# Patient Record
Sex: Male | Born: 1975
Health system: Southern US, Community
[De-identification: ages and names within clinical notes are randomized; demographics above are authoritative.]

## PROBLEM LIST (undated history)

## (undated) DIAGNOSIS — Z9889 Other specified postprocedural states: Secondary | ICD-10-CM

## (undated) DIAGNOSIS — R112 Nausea with vomiting, unspecified: Secondary | ICD-10-CM

## (undated) DIAGNOSIS — I10 Essential (primary) hypertension: Secondary | ICD-10-CM

## (undated) DIAGNOSIS — D509 Iron deficiency anemia, unspecified: Secondary | ICD-10-CM

## (undated) HISTORY — DX: Iron deficiency anemia, unspecified: D50.9

## (undated) HISTORY — PX: TONSILLECTOMY: SUR1361

## (undated) HISTORY — PX: KNEE ARTHROSCOPY: SHX127

## (undated) HISTORY — PX: FRACTURE SURGERY: SHX138

---

## 2000-08-13 ENCOUNTER — Emergency Department (HOSPITAL_COMMUNITY): Admission: EM | Admit: 2000-08-13 | Discharge: 2000-08-13 | Payer: Self-pay | Admitting: Emergency Medicine

## 2000-08-14 ENCOUNTER — Encounter: Payer: Self-pay | Admitting: Emergency Medicine

## 2001-12-19 ENCOUNTER — Ambulatory Visit (HOSPITAL_COMMUNITY): Admission: RE | Admit: 2001-12-19 | Discharge: 2001-12-19 | Payer: Self-pay | Admitting: Internal Medicine

## 2001-12-19 ENCOUNTER — Encounter: Payer: Self-pay | Admitting: Internal Medicine

## 2002-06-28 ENCOUNTER — Ambulatory Visit (HOSPITAL_COMMUNITY): Admission: RE | Admit: 2002-06-28 | Discharge: 2002-06-28 | Payer: Self-pay | Admitting: Family Medicine

## 2002-06-28 ENCOUNTER — Encounter: Payer: Self-pay | Admitting: Family Medicine

## 2003-11-12 ENCOUNTER — Emergency Department (HOSPITAL_COMMUNITY): Admission: EM | Admit: 2003-11-12 | Discharge: 2003-11-12 | Payer: Self-pay | Admitting: *Deleted

## 2003-11-14 ENCOUNTER — Ambulatory Visit (HOSPITAL_BASED_OUTPATIENT_CLINIC_OR_DEPARTMENT_OTHER): Admission: RE | Admit: 2003-11-14 | Discharge: 2003-11-14 | Payer: Self-pay | Admitting: Orthopedic Surgery

## 2003-11-14 ENCOUNTER — Ambulatory Visit (HOSPITAL_COMMUNITY): Admission: RE | Admit: 2003-11-14 | Discharge: 2003-11-14 | Payer: Self-pay | Admitting: Orthopedic Surgery

## 2010-09-16 ENCOUNTER — Ambulatory Visit (HOSPITAL_COMMUNITY)
Admission: RE | Admit: 2010-09-16 | Discharge: 2010-09-16 | Disposition: A | Discharge status: Home / Self Care | Payer: BC Managed Care – PPO | Source: Ambulatory Visit | Attending: Family Medicine | Admitting: Family Medicine

## 2010-09-16 ENCOUNTER — Other Ambulatory Visit (HOSPITAL_COMMUNITY): Payer: Self-pay | Admitting: Family Medicine

## 2010-09-16 DIAGNOSIS — M25569 Pain in unspecified knee: Secondary | ICD-10-CM

## 2010-09-16 DIAGNOSIS — M25469 Effusion, unspecified knee: Secondary | ICD-10-CM

## 2011-10-12 ENCOUNTER — Encounter: Payer: Self-pay | Admitting: Family Medicine

## 2011-10-12 ENCOUNTER — Ambulatory Visit (INDEPENDENT_AMBULATORY_CARE_PROVIDER_SITE_OTHER): Payer: 59 | Admitting: Family Medicine

## 2011-10-12 DIAGNOSIS — R030 Elevated blood-pressure reading, without diagnosis of hypertension: Secondary | ICD-10-CM

## 2011-10-12 DIAGNOSIS — L259 Unspecified contact dermatitis, unspecified cause: Secondary | ICD-10-CM

## 2011-10-12 DIAGNOSIS — IMO0001 Reserved for inherently not codable concepts without codable children: Secondary | ICD-10-CM | POA: Insufficient documentation

## 2011-10-12 MED ORDER — TRIAMCINOLONE ACETONIDE 0.1 % EX CREA: TOPICAL_CREAM | CUTANEOUS | Status: DC

## 2011-10-12 MED ORDER — PREDNISONE 10 MG PO TABS: 10.0000 mg | ORAL_TABLET | Freq: Every day | ORAL | Status: DC

## 2011-10-12 NOTE — Progress Notes (Signed)
  Subjective:    Patient ID: Brent West, male    DOB: June 06, 1975, 36 y.o.   MRN: [id number]  HPI  Rash or 3 days. 4 days ago was bitten by a tick. Removed the tick was not engorged. He has had no fevers. Noticed a small area of redness around the tick bite and started putting some antibiotic ointment on it. Also noticed a small red area in the mid sternal area and started putting antibiotic ointment on that as well. Last night and this morning the both areas are inflamed, rash apparent, itchy. No fevers, no myalgias.  Elevated blood pressure today. He reports it has been intermittently elevated on the few times that he seen a physician in the last few years. He does not have any symptoms of headache, chest pain, shortness of breath, edema. He has never been on blood pressure medicine.  Review of Systems See HPI    Objective:   Physical Exam  Vital signs reviewed. GENERAL: Well-developed, well-nourished, no acute distress. ABDOMEN: Soft positive bowel sounds. NT no rebound SKIN: two well circumscribed square shaped area 94x4) covered by gauze. Underneath there is paulr red area with some confluece. No satellte lesions. Square areas are in central chest over sternum and left mid quadrant and co-incide with areas under 4x4 quaze. Borders are very distinct.        Assessment & Plan:

## 2011-11-17 ENCOUNTER — Ambulatory Visit: Payer: 59 | Admitting: Family Medicine

## 2015-08-15 DIAGNOSIS — I10 Essential (primary) hypertension: Secondary | ICD-10-CM | POA: Diagnosis not present

## 2015-08-15 DIAGNOSIS — E782 Mixed hyperlipidemia: Secondary | ICD-10-CM | POA: Diagnosis not present

## 2015-08-15 DIAGNOSIS — E6609 Other obesity due to excess calories: Secondary | ICD-10-CM | POA: Diagnosis not present

## 2015-08-15 DIAGNOSIS — Z1389 Encounter for screening for other disorder: Secondary | ICD-10-CM | POA: Diagnosis not present

## 2015-08-15 DIAGNOSIS — Z6834 Body mass index (BMI) 34.0-34.9, adult: Secondary | ICD-10-CM | POA: Diagnosis not present

## 2015-08-15 MED FILL — LISINOPRIL 20 MG TABLET: 20 | 90 days supply | Qty: 180 | Fill #0

## 2015-11-17 DIAGNOSIS — Z681 Body mass index (BMI) 19 or less, adult: Secondary | ICD-10-CM | POA: Diagnosis not present

## 2015-11-17 DIAGNOSIS — Z1389 Encounter for screening for other disorder: Secondary | ICD-10-CM | POA: Diagnosis not present

## 2015-11-17 DIAGNOSIS — J069 Acute upper respiratory infection, unspecified: Secondary | ICD-10-CM | POA: Diagnosis not present

## 2015-11-17 DIAGNOSIS — H6122 Impacted cerumen, left ear: Secondary | ICD-10-CM | POA: Diagnosis not present

## 2015-12-15 MED FILL — LISINOPRIL 20 MG TABLET: 20 | 90 days supply | Qty: 180 | Fill #1

## 2016-03-25 MED FILL — LISINOPRIL 20 MG TABLET: 20 | 90 days supply | Qty: 180 | Fill #2

## 2016-04-14 DIAGNOSIS — K219 Gastro-esophageal reflux disease without esophagitis: Secondary | ICD-10-CM | POA: Diagnosis not present

## 2016-04-14 DIAGNOSIS — R070 Pain in throat: Secondary | ICD-10-CM | POA: Diagnosis not present

## 2016-04-14 DIAGNOSIS — Z1389 Encounter for screening for other disorder: Secondary | ICD-10-CM | POA: Diagnosis not present

## 2016-04-14 DIAGNOSIS — J343 Hypertrophy of nasal turbinates: Secondary | ICD-10-CM | POA: Diagnosis not present

## 2016-04-14 DIAGNOSIS — I10 Essential (primary) hypertension: Secondary | ICD-10-CM | POA: Diagnosis not present

## 2016-04-14 DIAGNOSIS — J069 Acute upper respiratory infection, unspecified: Secondary | ICD-10-CM | POA: Diagnosis not present

## 2016-04-14 DIAGNOSIS — Z6834 Body mass index (BMI) 34.0-34.9, adult: Secondary | ICD-10-CM | POA: Diagnosis not present

## 2016-04-14 DIAGNOSIS — J329 Chronic sinusitis, unspecified: Secondary | ICD-10-CM | POA: Diagnosis not present

## 2016-07-19 MED FILL — LISINOPRIL 20 MG TABLET: 20 | 90 days supply | Qty: 180 | Fill #3

## 2016-08-16 DIAGNOSIS — Z1389 Encounter for screening for other disorder: Secondary | ICD-10-CM | POA: Diagnosis not present

## 2016-08-16 DIAGNOSIS — M7060 Trochanteric bursitis, unspecified hip: Secondary | ICD-10-CM | POA: Diagnosis not present

## 2016-08-16 DIAGNOSIS — Z6833 Body mass index (BMI) 33.0-33.9, adult: Secondary | ICD-10-CM | POA: Diagnosis not present

## 2016-10-29 MED FILL — LISINOPRIL 20 MG TABLET: 20 | 90 days supply | Qty: 180 | Fill #0

## 2017-02-21 MED FILL — LISINOPRIL 20 MG TABS: 20 | 90 days supply | Qty: 180 | Fill #1

## 2017-05-01 ENCOUNTER — Encounter (HOSPITAL_COMMUNITY): Payer: Self-pay | Admitting: Emergency Medicine

## 2017-05-01 ENCOUNTER — Emergency Department (HOSPITAL_COMMUNITY): Payer: 59

## 2017-05-01 ENCOUNTER — Emergency Department (HOSPITAL_COMMUNITY)
Admission: EM | Admit: 2017-05-01 | Discharge: 2017-05-01 | Disposition: A | Discharge status: Home / Self Care | Payer: 59 | Attending: Emergency Medicine | Admitting: Emergency Medicine

## 2017-05-01 ENCOUNTER — Other Ambulatory Visit: Payer: Self-pay

## 2017-05-01 DIAGNOSIS — I10 Essential (primary) hypertension: Secondary | ICD-10-CM | POA: Diagnosis not present

## 2017-05-01 DIAGNOSIS — R0789 Other chest pain: Secondary | ICD-10-CM | POA: Diagnosis not present

## 2017-05-01 DIAGNOSIS — D649 Anemia, unspecified: Secondary | ICD-10-CM | POA: Insufficient documentation

## 2017-05-01 DIAGNOSIS — R079 Chest pain, unspecified: Secondary | ICD-10-CM | POA: Diagnosis not present

## 2017-05-01 DIAGNOSIS — R0602 Shortness of breath: Secondary | ICD-10-CM | POA: Diagnosis not present

## 2017-05-01 DIAGNOSIS — Z79899 Other long term (current) drug therapy: Secondary | ICD-10-CM | POA: Diagnosis not present

## 2017-05-01 HISTORY — DX: Essential (primary) hypertension: I10

## 2017-05-01 LAB — CBC
HCT: 28.1 % — ABNORMAL LOW (ref 39.0–52.0)
Hemoglobin: 7.9 g/dL — ABNORMAL LOW (ref 13.0–17.0)
MCH: 20.8 pg — ABNORMAL LOW (ref 26.0–34.0)
MCHC: 28.1 g/dL — ABNORMAL LOW (ref 30.0–36.0)
MCV: 73.9 fL — AB (ref 78.0–100.0)
PLATELETS: 396 10*3/uL (ref 150–400)
RBC: 3.8 MIL/uL — ABNORMAL LOW (ref 4.22–5.81)
RDW: 16.4 % — AB (ref 11.5–15.5)
WBC: 5.1 10*3/uL (ref 4.0–10.5)

## 2017-05-01 LAB — BASIC METABOLIC PANEL
Anion gap: 10 (ref 5–15)
BUN: 17 mg/dL (ref 6–20)
CALCIUM: 8.9 mg/dL (ref 8.9–10.3)
CO2: 21 mmol/L — ABNORMAL LOW (ref 22–32)
CREATININE: 1.39 mg/dL — AB (ref 0.61–1.24)
Chloride: 104 mmol/L (ref 101–111)
GFR calc Af Amer: >60 mL/min (ref >60)
Glucose, Bld: 135 mg/dL — ABNORMAL HIGH (ref 65–99)
Potassium: 4.1 mmol/L (ref 3.5–5.1)
Sodium: 135 mmol/L (ref 135–145)

## 2017-05-01 LAB — TROPONIN I

## 2017-05-01 LAB — RETICULOCYTES
RBC.: 3.65 MIL/uL — ABNORMAL LOW (ref 4.22–5.81)
Retic Count, Absolute: 109.5 10*3/uL (ref 19.0–186.0)
Retic Ct Pct: 3 % (ref 0.4–3.1)

## 2017-05-01 LAB — I-STAT TROPONIN, ED: TROPONIN I, POC: 0 ng/mL (ref 0.00–0.08)

## 2017-05-01 MED ORDER — SODIUM CHLORIDE 0.9 % IV BOLUS (SEPSIS)
1000.0000 mL | Freq: Once | INTRAVENOUS | Status: AC
  Administered 2017-05-01: 1000 mL via INTRAVENOUS

## 2017-05-01 MED ORDER — KETOROLAC TROMETHAMINE 30 MG/ML IJ SOLN
15.0000 mg | Freq: Once | INTRAMUSCULAR | Status: AC
  Administered 2017-05-01: 15 mg via INTRAVENOUS
  Filled 2017-05-01: qty 1

## 2017-05-01 NOTE — Discharge Instructions (Signed)
As discussed, today's evaluation has been most notable for demonstration of anemia. It is important that you follow-up with your primary care physician to discuss today's studies, and the outstanding laboratory study results. In the interim, please take a multivitamin that contains iron supplement.  Return here for concerning changes in your condition.

## 2017-05-01 NOTE — ED Provider Notes (Signed)
[facility] EMERGENCY DEPARTMENT Provider Note   CSN: [id number] Arrival date & time: 05/01/17  1539     History   Chief Complaint Chief Complaint  Patient presents with  . Chest Pain    HPI Brent West is a 42 y.o. male.  HPI Patient presents with concern of chest pain. Just prior to ED arrival the patient was cutting tree branches, when he felt tightness in his chest, pain radiating down his left arm. Symptoms have improved substantially since that time. He notes that over the past few days he has had episodes of dyspnea, but no similar pain. He states that he is generally well aside from history of hypertension. He is here with his wife who assists with the HPI. Patient does not smoke, does not drink.  Past Medical History:  Diagnosis Date  . Hypertension     Patient Active Problem List   Diagnosis Date Noted  . Blood pressure elevated 10/12/2011    Past Surgical History:  Procedure Laterality Date  . FRACTURE SURGERY     right thumb pinned then pin removed  . KNEE ARTHROSCOPY     age 64, repeat age 79 left       Home Medications    Prior to Admission medications   Medication Sig Start Date End Date Taking? Authorizing Provider  diphenhydrAMINE (BENADRYL) 25 MG tablet Take 25 mg by mouth every 6 (six) hours as needed for allergies.   Yes [provider]  ibuprofen (ADVIL,MOTRIN) 200 MG tablet Take 600 mg by mouth every 6 (six) hours as needed for mild pain or moderate pain.   Yes [provider]  lisinopril (PRINIVIL,ZESTRIL) 20 MG tablet Take 20 mg by mouth 2 (two) times daily. 02/21/17  Yes [provider]  loratadine (CLARITIN) 10 MG tablet Take 10 mg by mouth daily.   Yes [provider]  triamcinolone cream (KENALOG) 0.1 % Apply to affected area 4 or five times a day as needed for itching. Patient not taking: Reported on 05/01/2017 10/12/11   Dickie La, MD    Family History No family history on  file.  Social History Social History   Tobacco Use  . Smoking status: Never Smoker  . Smokeless tobacco: Never Used  Substance Use Topics  . Alcohol use: No  . Drug use: No     Allergies   Patient has no known allergies.   Review of Systems Review of Systems  Constitutional:       Per HPI, otherwise negative  HENT:       Per HPI, otherwise negative  Respiratory:       Per HPI, otherwise negative  Cardiovascular:       Per HPI, otherwise negative  Gastrointestinal: Negative for vomiting.  Endocrine:       Negative aside from HPI  Genitourinary:       Neg aside from HPI   Musculoskeletal:       Per HPI, otherwise negative  Skin: Negative.   Neurological: Negative for syncope.     Physical Exam Updated Vital Signs BP 120/79   Pulse 94   Temp 98.4 F (36.9 C) (Oral)   Resp 16   Ht 5\' 9"  (1.753 m)   Wt 104.3 kg (230 lb)   SpO2 100%   BMI 33.97 kg/m   Physical Exam  Constitutional: He is oriented to person, place, and time. He appears well-developed. No distress.  HENT:  Head: Normocephalic and atraumatic.  Eyes: Conjunctivae and  EOM are normal.  Cardiovascular: Regular rhythm. Tachycardia present.  Pulmonary/Chest: Effort normal. No stridor. No respiratory distress.  Abdominal: He exhibits no distension.  Genitourinary: Rectal exam shows no external hemorrhoid and no tenderness.  Genitourinary Comments: Hemoccult negative, stool grossly negative  Musculoskeletal: He exhibits no edema.  Neurological: He is alert and oriented to person, place, and time.  Skin: Skin is warm and dry.  Psychiatric: He has a normal mood and affect.  Nursing note and vitals reviewed.    ED Treatments / Results  Labs (all labs ordered are listed, but only abnormal results are displayed) Labs Reviewed  BASIC METABOLIC PANEL - Abnormal; Notable for the following components:      Result Value   CO2 21 (*)    Glucose, Bld 135 (*)    Creatinine, Ser 1.39 (*)    All other  components within normal limits  CBC - Abnormal; Notable for the following components:   RBC 3.80 (*)    Hemoglobin 7.9 (*)    HCT 28.1 (*)    MCV 73.9 (*)    MCH 20.8 (*)    MCHC 28.1 (*)    RDW 16.4 (*)    All other components within normal limits  RETICULOCYTES - Abnormal; Notable for the following components:   RBC. 3.65 (*)    All other components within normal limits  TROPONIN I  OCCULT BLOOD X 1 CARD TO LAB, STOOL  VITAMIN B12  FOLATE  IRON AND TIBC  FERRITIN  I-STAT TROPONIN, ED    EKG  EKG Interpretation  Date/Time:  Sunday May 01 2017 15:48:03 EST Ventricular Rate:  111 PR Interval:    QRS Duration: 84 QT Interval:  313 QTC Calculation: 426 R Axis:   12 Text Interpretation:  Sinus tachycardia Atrial premature complex Abnormal R-wave progression, early transition Left ventricular hypertrophy Abnormal ekg Confirmed by Carmin Muskrat [id number]) on 05/01/2017 5:26:43 PM       Radiology Dg Chest 2 View  Result Date: 05/01/2017 CLINICAL DATA:  Chest pain and shortness of breath EXAM: CHEST  2 VIEW COMPARISON:  None. FINDINGS: The heart size and mediastinal contours are within normal limits. There is no focal infiltrate, pulmonary edema, or pleural effusion. The visualized skeletal structures are unremarkable. IMPRESSION: No active cardiopulmonary disease. Electronically Signed   By: Abelardo Diesel M.D.   On: 05/01/2017 16:40    Procedures Procedures (including critical care time)  Medications Ordered in ED Medications  sodium chloride 0.9 % bolus 1,000 mL (0 mLs Intravenous Stopped 05/01/17 1745)  ketorolac (TORADOL) 30 MG/ML injection 15 mg (15 mg Intravenous Given 05/01/17 1650)     Initial Impression / Assessment and Plan / ED Course  I have reviewed the triage vital signs and the nursing notes.  Pertinent labs & imaging results that were available during my care of the patient were reviewed by me and considered in my medical decision making (see chart for  details).   Initial evaluation on repeat exam the patient remains in similar condition. Initial findings concerning for anemia with decreased renal function. Patient denies history of renal issues, and again denies any recent issues with things like melena, black stool.  On repeat exam, Hemoccult card is negative, stool is grossly negative. Patient is receiving IV fluids, has no new complaints.  8:09 PM On repeat exam the patient is awake and alert, heart rate now in the 70s, no ongoing complaints. Second troponin is unremarkable, given the nonischemic EKG, there is low suspicion for  ongoing coronary ischemia. X-ray was reassuring, and there is no hypoxia, no evidence for PE, pneumonia, or other acute new pulmonary condition. We discussed the patient's anemia again, at length, and a anemia panel has been sent for outpatient follow-up.  We discussed possibilities for his anemia at length, including vitamin deficiency versus intrinsic disease versus renal feedback loop dysfunction. No evidence for GI bleed. Patient has a primary care physician, will call tomorrow for an appointment in the coming days to follow-up on today's labs. In the interim he will also start a monthly multivitamin empirically.   Symptomatically anemia Atypical chest pain   Carmin Muskrat, MD 05/01/17 2011

## 2017-05-01 NOTE — ED Triage Notes (Signed)
Patient c/o left side chest pain that radiates into left arm. Per patient shortness of breath, nausea, and dizziness. Denies any cardiac hx. Per patient took 2 150mg  aspirin prior to coming to ER.

## 2017-05-01 NOTE — ED Notes (Signed)
Pt transported to Xray. 

## 2017-05-02 LAB — IRON AND TIBC
Iron: 10 ug/dL — ABNORMAL LOW (ref 45–182)
SATURATION RATIOS: 2 % — AB (ref 17.9–39.5)
TIBC: 405 ug/dL (ref 250–450)
UIBC: 395 ug/dL

## 2017-05-02 LAB — VITAMIN B12: Vitamin B-12: 180 pg/mL (ref 180–914)

## 2017-05-02 LAB — FERRITIN: FERRITIN: 3 ng/mL — AB (ref 24–336)

## 2017-05-02 LAB — FOLATE: Folate: 17.2 ng/mL (ref >5.9)

## 2017-05-04 DIAGNOSIS — D509 Iron deficiency anemia, unspecified: Secondary | ICD-10-CM | POA: Diagnosis not present

## 2017-05-04 DIAGNOSIS — E6609 Other obesity due to excess calories: Secondary | ICD-10-CM | POA: Diagnosis not present

## 2017-05-04 DIAGNOSIS — R079 Chest pain, unspecified: Secondary | ICD-10-CM | POA: Diagnosis not present

## 2017-05-04 DIAGNOSIS — K219 Gastro-esophageal reflux disease without esophagitis: Secondary | ICD-10-CM | POA: Diagnosis not present

## 2017-05-04 DIAGNOSIS — R7309 Other abnormal glucose: Secondary | ICD-10-CM | POA: Diagnosis not present

## 2017-05-04 DIAGNOSIS — Z6832 Body mass index (BMI) 32.0-32.9, adult: Secondary | ICD-10-CM | POA: Diagnosis not present

## 2017-05-04 DIAGNOSIS — Z1389 Encounter for screening for other disorder: Secondary | ICD-10-CM | POA: Diagnosis not present

## 2017-05-04 MED FILL — PANTOPRAZOLE SOD DR 40 MG T: 40 | 90 days supply | Qty: 90 | Fill #0

## 2017-05-20 ENCOUNTER — Encounter (INDEPENDENT_AMBULATORY_CARE_PROVIDER_SITE_OTHER): Payer: Self-pay | Admitting: Internal Medicine

## 2017-05-20 ENCOUNTER — Ambulatory Visit (INDEPENDENT_AMBULATORY_CARE_PROVIDER_SITE_OTHER): Payer: 59 | Admitting: Internal Medicine

## 2017-05-20 VITALS — BP 140/80 | HR 72 | Temp 97.9°F | Ht 69.0 in | Wt 225.9 lb

## 2017-05-20 DIAGNOSIS — R195 Other fecal abnormalities: Secondary | ICD-10-CM

## 2017-05-20 DIAGNOSIS — D500 Iron deficiency anemia secondary to blood loss (chronic): Secondary | ICD-10-CM | POA: Insufficient documentation

## 2017-05-20 LAB — CBC
HCT: 36 % — ABNORMAL LOW (ref 38.5–50.0)
HEMOGLOBIN: 10.7 g/dL — AB (ref 13.2–17.1)
MCH: 23.4 pg — AB (ref 27.0–33.0)
MCHC: 29.7 g/dL — AB (ref 32.0–36.0)
MCV: 78.6 fL — ABNORMAL LOW (ref 80.0–100.0)
MPV: 10.1 fL (ref 7.5–12.5)
PLATELETS: 320 10*3/uL (ref 140–400)
RBC: 4.58 10*6/uL (ref 4.20–5.80)
RDW: 22.9 % — AB (ref 11.0–15.0)
WBC: 4 10*3/uL (ref 3.8–10.8)

## 2017-05-20 NOTE — Patient Instructions (Signed)
Take the Protonix daily. EGD, possible colonoscopy.

## 2017-05-20 NOTE — Progress Notes (Signed)
   Subjective:    Patient ID: Brent West, male    DOB: [birth year], 42 y.o.   MRN: [id number]  HPI Referred by Dr. Hilma Favors for anemia.Seen in the ED at [facility] 05/01/2017 for chest pain. Had been cutting tree branches prior to arrival to the ED. Had tightness in his chest, radiating down his left arm. Felt like someone was standing on his chest.   On arrival to the ED, his symptoms had improved.  Told he was anemic. Stool in the ED was negative. Hemoglobin 7.9 and iron studies were abnormal (see below). Hemoglobin in 2012 was 16.9.  Cardiac work up was negative.  Has been having dizzy spells for a few months. Didn't think too much of this. About a month ago, (Christmas), he felt more tired. He just thought may be wasn't getting enough sleep. Rarely has acid reflux. Has cut back on what he eats.   No family hx of colon cancer. Father has hx of colon polyps. No family hx of cancer. Family hx of heart disease.    Had been taking Motrin '200mg'$  (3-4 tabs a day). Stopped 2 weeks ago.      CBC Latest Ref Rng & Units 05/01/2017  WBC 4.0 - 10.5 K/uL 5.1  Hemoglobin 13.0 - 17.0 g/dL 7.9(L)  Hematocrit 39.0 - 52.0 % 28.1(L)  Platelets 150 - 400 K/uL 396   05/01/2017 ferritin 3, iron 10, TIBC 405, Saturation Ratios 2. UIBC 2 Vitamin B12 180. Ret ct PCT 3.0 (normal. RBC 3.65 (low).  Folate 17.2.  04/21/2016 Troponin were normal.  Review of Systems Past Medical History:  Diagnosis Date  . Hypertension     Past Surgical History:  Procedure Laterality Date  . FRACTURE SURGERY     right thumb pinned then pin removed  . KNEE ARTHROSCOPY     age 67, repeat age 1 left    No Known Allergies  Current Outpatient Medications on File Prior to Visit  Medication Sig Dispense Refill  . diphenhydrAMINE (BENADRYL) 25 MG tablet Take 25 mg by mouth every 6 (six) hours as needed for allergies.    . ferrous sulfate 325 (65 FE) MG tablet Take 325 mg by mouth daily with breakfast.    . lisinopril  (PRINIVIL,ZESTRIL) 20 MG tablet Take 20 mg by mouth 2 (two) times daily.  3  . loratadine (CLARITIN) 10 MG tablet Take 10 mg by mouth daily.    . pantoprazole (PROTONIX) 40 MG tablet Take 40 mg by mouth daily.     No current facility-administered medications on file prior to visit.         Objective:   Physical Exam Blood pressure 140/80, pulse 72, temperature 97.9 F (36.6 C), height '5\' 9"'$  (1.753 m), weight 225 lb 14.4 oz (102.5 kg). Alert and oriented. Skin warm and dry. Oral mucosa is moist.   . Sclera anicteric, conjunctivae is pink. Thyroid not enlarged. No cervical lymphadenopathy. Lungs clear. Heart regular rate and rhythm.  Abdomen is soft. Bowel sounds are positive. No hepatomegaly. No abdominal masses felt. No tenderness.  No edema to lower extremities.          Assessment & Plan:  IDA. Guaiac positive stool. Suspect anemia maybe NSAID induced. Will schedule an EGD to rule out PUD. He will take the Protonix BID. (Patient called and advised)

## 2017-05-25 ENCOUNTER — Encounter (HOSPITAL_COMMUNITY)
Admission: RE | Disposition: A | Discharge status: Home / Self Care | Payer: Self-pay | Source: Ambulatory Visit | Attending: Internal Medicine

## 2017-05-25 ENCOUNTER — Ambulatory Visit (HOSPITAL_COMMUNITY)
Admission: RE | Admit: 2017-05-25 | Discharge: 2017-05-25 | Disposition: A | Discharge status: Home / Self Care | Payer: 59 | Source: Ambulatory Visit | Attending: Internal Medicine | Admitting: Internal Medicine

## 2017-05-25 ENCOUNTER — Other Ambulatory Visit: Payer: Self-pay

## 2017-05-25 ENCOUNTER — Encounter (HOSPITAL_COMMUNITY): Payer: Self-pay | Admitting: *Deleted

## 2017-05-25 DIAGNOSIS — D500 Iron deficiency anemia secondary to blood loss (chronic): Secondary | ICD-10-CM | POA: Insufficient documentation

## 2017-05-25 DIAGNOSIS — K228 Other specified diseases of esophagus: Secondary | ICD-10-CM | POA: Insufficient documentation

## 2017-05-25 DIAGNOSIS — K449 Diaphragmatic hernia without obstruction or gangrene: Secondary | ICD-10-CM | POA: Insufficient documentation

## 2017-05-25 DIAGNOSIS — K2970 Gastritis, unspecified, without bleeding: Secondary | ICD-10-CM | POA: Diagnosis not present

## 2017-05-25 DIAGNOSIS — R195 Other fecal abnormalities: Secondary | ICD-10-CM | POA: Diagnosis not present

## 2017-05-25 DIAGNOSIS — I10 Essential (primary) hypertension: Secondary | ICD-10-CM | POA: Insufficient documentation

## 2017-05-25 DIAGNOSIS — Z791 Long term (current) use of non-steroidal anti-inflammatories (NSAID): Secondary | ICD-10-CM | POA: Diagnosis not present

## 2017-05-25 DIAGNOSIS — Z79899 Other long term (current) drug therapy: Secondary | ICD-10-CM | POA: Diagnosis not present

## 2017-05-25 HISTORY — DX: Nausea with vomiting, unspecified: R11.2

## 2017-05-25 HISTORY — PX: ESOPHAGOGASTRODUODENOSCOPY: SHX5428

## 2017-05-25 HISTORY — DX: Other specified postprocedural states: Z98.890

## 2017-05-25 SURGERY — EGD (ESOPHAGOGASTRODUODENOSCOPY)
Anesthesia: Moderate Sedation

## 2017-05-25 MED ORDER — LIDOCAINE VISCOUS 2 % MT SOLN
OROMUCOSAL | Status: DC | PRN
  Administered 2017-05-25: 1 via OROMUCOSAL

## 2017-05-25 MED ORDER — MEPERIDINE HCL 50 MG/ML IJ SOLN
INTRAMUSCULAR | Status: DC | PRN
  Administered 2017-05-25 (×2): 25 mg

## 2017-05-25 MED ORDER — MIDAZOLAM HCL 5 MG/5ML IJ SOLN
INTRAMUSCULAR | Status: DC | PRN
  Administered 2017-05-25: 2 mg via INTRAVENOUS
  Administered 2017-05-25: 3 mg via INTRAVENOUS
  Administered 2017-05-25: 2 mg via INTRAVENOUS

## 2017-05-25 MED ORDER — PANTOPRAZOLE SODIUM 40 MG PO TBEC: 40.0000 mg | DELAYED_RELEASE_TABLET | Freq: Every day | ORAL | 5 refills | Status: DC

## 2017-05-25 MED ORDER — MEPERIDINE HCL 50 MG/ML IJ SOLN
INTRAMUSCULAR | Status: AC
  Filled 2017-05-25: qty 1

## 2017-05-25 MED ORDER — SODIUM CHLORIDE 0.9 % IV SOLN
INTRAVENOUS | Status: DC
  Administered 2017-05-25: 1000 mL via INTRAVENOUS

## 2017-05-25 MED ORDER — MIDAZOLAM HCL 5 MG/5ML IJ SOLN
INTRAMUSCULAR | Status: AC
  Filled 2017-05-25: qty 10

## 2017-05-25 MED ORDER — IBUPROFEN 200 MG PO TABS: 400.0000 mg | ORAL_TABLET | Freq: Two times a day (BID) | ORAL | 0 refills | Status: AC | PRN

## 2017-05-25 MED ORDER — LIDOCAINE VISCOUS 2 % MT SOLN
OROMUCOSAL | Status: AC
  Filled 2017-05-25: qty 15

## 2017-05-25 MED ORDER — STERILE WATER FOR IRRIGATION IR SOLN
Status: DC | PRN
  Administered 2017-05-25: 15:00:00

## 2017-05-25 NOTE — Progress Notes (Signed)
AVS did not capture for wife Sharyn Lull to sign.  Discharge instructions with 2 copies of note for work given to wife Sharyn Lull.

## 2017-05-25 NOTE — Progress Notes (Deleted)
Brent West cannot drive, operate heavy machinery, or sign legal documents until after 4pm on Thursday May 25, 2017.

## 2017-05-25 NOTE — Progress Notes (Signed)
Brent West cannot drive, operate heavy machinery, or sign legal documents until after 4pm on Thursday February 7th, 2019.

## 2017-05-25 NOTE — Discharge Instructions (Signed)
Resume usual medications including ferrous sulfate as before. Resume usual diet. No driving for 24 hours. Colonoscopy to be scheduled.  Office will call.       Esophagogastroduodenoscopy, Care After Refer to this sheet in the next few weeks. These instructions provide you with information about caring for yourself after your procedure. Your health care provider may also give you more specific instructions. Your treatment has been planned according to current medical practices, but problems sometimes occur. Call your health care provider if you have any problems or questions after your procedure. What can I expect after the procedure? After the procedure, it is common to have:  A sore throat.  Nausea.  Bloating.  Dizziness.  Fatigue.  Follow these instructions at home:  Do not eat or drink anything until the numbing medicine (local anesthetic) has worn off and your gag reflex has returned. You will know that the local anesthetic has worn off when you can swallow comfortably.  Do not drive for 24 hours if you received a medicine to help you relax (sedative).  If your health care provider took a tissue sample for testing during the procedure, make sure to get your test results. This is your responsibility. Ask your health care provider or the department performing the test when your results will be ready.  Keep all follow-up visits as told by your health care provider. This is important. Contact a health care provider if:  You cannot stop coughing.  You are not urinating.  You are urinating less than usual. Get help right away if:  You have trouble swallowing.  You cannot eat or drink.  You have throat or chest pain that gets worse.  You are dizzy or light-headed.  You faint.  You have nausea or vomiting.  You have chills.  You have a fever.  You have severe abdominal pain.  You have black, tarry, or bloody stools. This information is not intended to replace  advice given to you by your health care provider. Make sure you discuss any questions you have with your health care provider. Document Released: 03/22/2012 Document Revised: 09/11/2015 Document Reviewed: 02/27/2015 Elsevier Interactive Patient Education  2018 [employer].    Hiatal Hernia A hiatal hernia occurs when part of the stomach slides above the muscle that separates the abdomen from the chest (diaphragm). A person can be born with a hiatal hernia (congenital), or it may develop over time. In almost all cases of hiatal hernia, only the top part of the stomach pushes through the diaphragm. Many people have a hiatal hernia with no symptoms. The larger the hernia, the more likely it is that you will have symptoms. In some cases, a hiatal hernia allows stomach acid to flow back into the tube that carries food from your mouth to your stomach (esophagus). This may cause heartburn symptoms. Severe heartburn symptoms may mean that you have developed a condition called gastroesophageal reflux disease (GERD). What are the causes? This condition is caused by a weakness in the opening (hiatus) where the esophagus passes through the diaphragm to attach to the upper part of the stomach. A person may be born with a weakness in the hiatus, or a weakness can develop over time. What increases the risk? This condition is more likely to develop in:  Older people. Age is a major risk factor for a hiatal hernia, especially if you are over the age of 80.  Pregnant women.  People who are overweight.  People who have frequent constipation.  What are the signs or symptoms? Symptoms of this condition usually develop in the form of GERD symptoms. Symptoms include:  Heartburn.  Belching.  Indigestion.  Trouble swallowing.  Coughing or wheezing.  Sore throat.  Hoarseness.  Chest pain.  Nausea and vomiting.  How is this diagnosed? This condition may be diagnosed during testing for GERD. Tests  that may be done include:  X-rays of your stomach or chest.  An upper gastrointestinal (GI) series. This is an X-ray exam of your GI tract that is taken after you swallow a chalky liquid that shows up clearly on the X-ray.  Endoscopy. This is a procedure to look into your stomach using a thin, flexible tube that has a tiny camera and light on the end of it.  How is this treated? This condition may be treated by:  Dietary and lifestyle changes to help reduce GERD symptoms.  Medicines. These may include: ? Over-the-counter antacids. ? Medicines that make your stomach empty more quickly. ? Medicines that block the production of stomach acid (H2 blockers). ? Stronger medicines to reduce stomach acid (proton pump inhibitors).  Surgery to repair the hernia, if other treatments are not helping.  If you have no symptoms, you may not need treatment. Follow these instructions at home: Lifestyle and activity  Do not use any products that contain nicotine or tobacco, such as cigarettes and e-cigarettes. If you need help quitting, ask your health care provider.  Try to achieve and maintain a healthy body weight.  Avoid putting pressure on your abdomen. Anything that puts pressure on your abdomen increases the amount of acid that may be pushed up into your esophagus. ? Avoid bending over, especially after eating. ? Raise the head of your bed by putting blocks under the legs. This keeps your head and esophagus higher than your stomach. ? Do not wear tight clothing around your chest or stomach. ? Try not to strain when having a bowel movement, when urinating, or when lifting heavy objects. Eating and drinking  Avoid foods that can worsen GERD symptoms. These may include: ? Fatty foods, like fried foods. ? Citrus fruits, like oranges or lemon. ? Other foods and drinks that contain acid, like orange juice or tomatoes. ? Spicy food. ? Chocolate.  Eat frequent small meals instead of three  large meals a day. This helps prevent your stomach from getting too full. ? Eat slowly. ? Do not lie down right after eating. ? Do not eat 1-2 hours before bed.  Do not drink beverages with caffeine. These include cola, coffee, cocoa, and tea.  Do not drink alcohol. General instructions  Take over-the-counter and prescription medicines only as told by your health care provider.  Keep all follow-up visits as told by your health care provider. This is important. Contact a health care provider if:  Your symptoms are not controlled with medicines or lifestyle changes.  You are having trouble swallowing.  You have coughing or wheezing that will not go away. Get help right away if:  Your pain is getting worse.  Your pain spreads to your arms, neck, jaw, teeth, or back.  You have shortness of breath.  You sweat for no reason.  You feel sick to your stomach (nauseous) or you vomit.  You vomit blood.  You have bright red blood in your stools.  You have black, tarry stools. This information is not intended to replace advice given to you by your health care provider. Make sure you discuss any questions  you have with your health care provider. Document Released: 06/26/2003 Document Revised: 03/29/2016 Document Reviewed: 03/29/2016 Elsevier Interactive Patient Education  2018 [employer]    Gastritis, Adult Gastritis is swelling (inflammation) of the stomach. When you have this condition, you can have these problems (symptoms):  Pain in your stomach.  A burning feeling in your stomach.  Feeling sick to your stomach (nauseous).  Throwing up (vomiting).  Feeling too full after you eat.  It is important to get help for this condition. Without help, your stomach can bleed, and you can get sores (ulcers) in your stomach. Follow these instructions at home:  Take over-the-counter and prescription medicines only as told by your doctor.  If you were prescribed an antibiotic  medicine, take it as told by your doctor. Do not stop taking it even if you start to feel better.  Drink enough fluid to keep your pee (urine) clear or pale yellow.  Instead of eating big meals, eat small meals often. Contact a health care provider if:  Your problems get worse.  Your problems go away and then come back. Get help right away if:  You throw up blood or something that looks like coffee grounds.  You have black or dark red poop (stools).  You cannot keep fluids down.  Your stomach pain gets worse.  You have a fever.  You do not feel better after 1 week. This information is not intended to replace advice given to you by your health care provider. Make sure you discuss any questions you have with your health care provider. Document Released: 09/22/2007 Document Revised: 12/03/2015 Document Reviewed: 12/28/2014 Elsevier Interactive Patient Education  [employer].

## 2017-05-25 NOTE — Op Note (Addendum)
[facility] Patient Name: Brent West Procedure Date: 05/25/2017 2:38 PM MRN: [id number] Date of Birth: [birth year] Attending MD: Hildred Laser , MD CSN: [id number] Age: 42 Admit Type: Outpatient Procedure:                Upper GI endoscopy Indications:              Iron deficiency anemia secondary to chronic blood                            loss Providers:                Hildred Laser, MD, Janeece Riggers, RN, Nelma Rothman,                            Technician Referring MD:             Halford Chessman, MD Medicines:                Lidocaine spray, Meperidine 50 mg IV, Midazolam 7                            mg IV Complications:            No immediate complications. Estimated Blood Loss:     Estimated blood loss: none. Procedure:                Pre-Anesthesia Assessment:                           - Prior to the procedure, a History and Physical                            was performed, and patient medications and                            allergies were reviewed. The patient's tolerance of                            previous anesthesia was also reviewed. The risks                            and benefits of the procedure and the sedation                            options and risks were discussed with the patient.                            All questions were answered, and informed consent                            was obtained. Prior Anticoagulants: The patient                            last took ibuprofen 14 days prior to the procedure.  ASA Grade Assessment: I - A normal, healthy                            patient. After reviewing the risks and benefits,                            the patient was deemed in satisfactory condition to                            undergo the procedure.                           After obtaining informed consent, the endoscope was                            passed under direct vision. Throughout the   procedure, the patient's blood pressure, pulse, and                            oxygen saturations were monitored continuously. The                            EG-[id number]) scope was introduced through the                            mouth, and advanced to the second part of duodenum.                            The upper GI endoscopy was accomplished without                            difficulty. The patient tolerated the procedure                            well. Scope In: 3:20:25 PM Scope Out: 3:26:11 PM Total Procedure Duration: 0 hours 5 minutes 46 seconds  Findings:      The examined esophagus was normal.      The Z-line was irregular and was found 36 cm from the incisors.      A 4 cm hiatal hernia was present.      Localized minimal inflammation characterized by erosions and erythema       was found in the prepyloric region of the stomach.      The exam of the stomach was otherwise normal.      The duodenal bulb and second portion of the duodenum were normal. Impression:               - Normal esophagus.                           - Z-line irregular, 36 cm from the incisors.                           - 4 cm hiatal hernia.                           -  Gastritis.                           - Normal duodenal bulb and second portion of the                            duodenum.                           - No specimens collected. Moderate Sedation:      Moderate (conscious) sedation was administered by the endoscopy nurse       and supervised by the endoscopist. The following parameters were       monitored: oxygen saturation, heart rate, blood pressure, CO2       capnography and response to care. Total physician intraservice time was       15 minutes. Recommendation:           - Patient has a contact number available for                            emergencies. The signs and symptoms of potential                            delayed complications were discussed with the                             patient. Return to normal activities tomorrow.                            Written discharge instructions were provided to the                            patient.                           - Resume previous diet today.                           - Continue present medications but decrease                            Pantoprazole to once daily before breakfast.                           - Ibuprofen 400 mgs po bid prn. Keep use to minimum.                           - H.Pylori serology to be done with next blood                            work(at time of colonoscopy).                           - Perform a colonoscopy at appointment to be  scheduled. Procedure Code(s):        --- Professional ---                           [phone], Esophagogastroduodenoscopy, flexible,                            transoral; diagnostic, including collection of                            specimen(s) by brushing or washing, when performed                            (separate procedure)                           99152, Moderate sedation services provided by the                            same physician or other qualified health care                            professional performing the diagnostic or                            therapeutic service that the sedation supports,                            requiring the presence of an independent trained                            observer to assist in the monitoring of the                            patient's level of consciousness and physiological                            status; initial 15 minutes of intraservice time,                            patient age 41 years or older Diagnosis Code(s):        --- Professional ---                           K22.8, Other specified diseases of esophagus                           K44.9, Diaphragmatic hernia without obstruction or                            gangrene                           K29.70,  Gastritis, unspecified, without bleeding  D50.0, Iron deficiency anemia secondary to blood                            loss (chronic) CPT copyright 2016 [employer]. All rights reserved. The codes documented in this report are preliminary and upon coder review may  be revised to meet current compliance requirements. Hildred Laser, MD Hildred Laser, MD 05/25/2017 3:41:03 PM This report has been signed electronically. Number of Addenda: 0

## 2017-05-25 NOTE — H&P (Signed)
Brent West is an 42 y.o. male.   Chief Complaint: Patient is here for EGD. HPI: Patient is 42 year old Caucasian male who was recently discovered to have iron deficiency anemia when he presented with progressive weakness and lightheadedness.  He was noted to have heme positive stool.  He denies melena or rectal bleeding nausea vomiting heartburn or dysphagia.  He has chronic left knee pain and takes ibuprofen 400 mg to 1200 mg daily.  He has been taking it daily for 2 years. Has good appetite and his weight has been stable. Hemoglobin has increased from 7.9 g 3 weeks ago to 10.7 last week with p.o. Iron. Family history is negative for CRC but his father has had colonic polyps.   Past Medical History:  Diagnosis Date  . Hypertension   . PONV (postoperative nausea and vomiting)     Past Surgical History:  Procedure Laterality Date  . FRACTURE SURGERY     right thumb pinned then pin removed  . KNEE ARTHROSCOPY     age 2, repeat age 55 left  . TONSILLECTOMY      Family History  Problem Relation Age of Onset  . Hypertension Mother   . Cancer Mother   . Hypertension Father   . Hypertension Sister    Social History:  reports that  has never smoked. he has never used smokeless tobacco. He reports that he does not drink alcohol or use drugs.  Allergies: No Known Allergies  Medications Prior to Admission  Medication Sig Dispense Refill  . acetaminophen (TYLENOL) 500 MG tablet Take 1,000 mg by mouth every 6 (six) hours as needed (for pain.).    Marland Kitchen diphenhydrAMINE (BENADRYL) 25 MG tablet Take 25 mg by mouth at bedtime as needed for allergies (for allergies).     . ferrous sulfate 325 (65 FE) MG tablet Take 325 mg by mouth 3 (three) times daily.     Marland Kitchen lisinopril (PRINIVIL,ZESTRIL) 20 MG tablet Take 20 mg by mouth 2 (two) times daily.  3  . loratadine (CLARITIN) 10 MG tablet Take 10 mg by mouth daily.    . Multiple Vitamins-Minerals (ADULT GUMMY PO) Take 2 tablets by mouth daily.     . pantoprazole (PROTONIX) 40 MG tablet Take 40 mg by mouth 2 (two) times daily.       No results found for this or any previous visit (from the past 48 hour(s)). No results found.  ROS  Blood pressure 112/83, pulse 81, temperature 98.2 F (36.8 C), temperature source Oral, resp. rate 19, height 5\' 9"  (1.753 m), weight 225 lb (102.1 kg), SpO2 99 %. Physical Exam  Constitutional: He appears well-developed and well-nourished.  HENT:  Mouth/Throat: Oropharynx is clear and moist.  Eyes: Conjunctivae are normal. No scleral icterus.  Neck: No thyromegaly present.  Cardiovascular: Normal rate, regular rhythm and normal heart sounds.  No murmur heard. Respiratory: Effort normal and breath sounds normal.  GI: Soft. He exhibits no distension and no mass. There is no tenderness.  Musculoskeletal: He exhibits no edema.  Lymphadenopathy:    He has no cervical adenopathy.  Neurological: He is alert.  Skin: Skin is warm and dry.     Assessment/Plan Heme positive stool and iron deficiency anemia. Chronic NSAID use. Diagnostic EGD.  Hildred Laser, MD 05/25/2017, 3:06 PM

## 2017-05-26 ENCOUNTER — Encounter (INDEPENDENT_AMBULATORY_CARE_PROVIDER_SITE_OTHER): Payer: Self-pay | Admitting: *Deleted

## 2017-05-26 ENCOUNTER — Other Ambulatory Visit (INDEPENDENT_AMBULATORY_CARE_PROVIDER_SITE_OTHER): Payer: Self-pay | Admitting: *Deleted

## 2017-05-26 DIAGNOSIS — D649 Anemia, unspecified: Secondary | ICD-10-CM | POA: Insufficient documentation

## 2017-05-26 DIAGNOSIS — D508 Other iron deficiency anemias: Secondary | ICD-10-CM

## 2017-05-27 ENCOUNTER — Encounter (HOSPITAL_COMMUNITY): Payer: Self-pay | Admitting: Internal Medicine

## 2017-06-08 ENCOUNTER — Ambulatory Visit: Payer: 59 | Admitting: Cardiology

## 2017-06-13 ENCOUNTER — Encounter (HOSPITAL_COMMUNITY)
Admission: RE | Disposition: A | Discharge status: Home / Self Care | Payer: Self-pay | Source: Ambulatory Visit | Attending: Internal Medicine

## 2017-06-13 ENCOUNTER — Encounter (HOSPITAL_COMMUNITY): Payer: Self-pay | Admitting: *Deleted

## 2017-06-13 ENCOUNTER — Ambulatory Visit (HOSPITAL_COMMUNITY)
Admission: RE | Admit: 2017-06-13 | Discharge: 2017-06-13 | Disposition: A | Discharge status: Home / Self Care | Payer: 59 | Source: Ambulatory Visit | Attending: Internal Medicine | Admitting: Internal Medicine

## 2017-06-13 ENCOUNTER — Other Ambulatory Visit: Payer: Self-pay

## 2017-06-13 ENCOUNTER — Ambulatory Visit: Payer: 59 | Admitting: Cardiology

## 2017-06-13 DIAGNOSIS — K635 Polyp of colon: Secondary | ICD-10-CM | POA: Insufficient documentation

## 2017-06-13 DIAGNOSIS — K648 Other hemorrhoids: Secondary | ICD-10-CM | POA: Diagnosis not present

## 2017-06-13 DIAGNOSIS — Z79899 Other long term (current) drug therapy: Secondary | ICD-10-CM | POA: Diagnosis not present

## 2017-06-13 DIAGNOSIS — D500 Iron deficiency anemia secondary to blood loss (chronic): Secondary | ICD-10-CM | POA: Insufficient documentation

## 2017-06-13 DIAGNOSIS — I10 Essential (primary) hypertension: Secondary | ICD-10-CM | POA: Diagnosis not present

## 2017-06-13 DIAGNOSIS — D509 Iron deficiency anemia, unspecified: Secondary | ICD-10-CM | POA: Diagnosis not present

## 2017-06-13 DIAGNOSIS — R195 Other fecal abnormalities: Secondary | ICD-10-CM | POA: Insufficient documentation

## 2017-06-13 DIAGNOSIS — D125 Benign neoplasm of sigmoid colon: Secondary | ICD-10-CM | POA: Diagnosis not present

## 2017-06-13 DIAGNOSIS — K5730 Diverticulosis of large intestine without perforation or abscess without bleeding: Secondary | ICD-10-CM | POA: Insufficient documentation

## 2017-06-13 DIAGNOSIS — D649 Anemia, unspecified: Secondary | ICD-10-CM | POA: Insufficient documentation

## 2017-06-13 DIAGNOSIS — D508 Other iron deficiency anemias: Secondary | ICD-10-CM

## 2017-06-13 DIAGNOSIS — D123 Benign neoplasm of transverse colon: Secondary | ICD-10-CM | POA: Diagnosis not present

## 2017-06-13 HISTORY — PX: COLONOSCOPY: SHX5424

## 2017-06-13 LAB — HEMOGLOBIN AND HEMATOCRIT, BLOOD
HCT: 49.8 % (ref 39.0–52.0)
HEMOGLOBIN: 13.9 g/dL (ref 13.0–17.0)

## 2017-06-13 SURGERY — COLONOSCOPY
Anesthesia: Moderate Sedation

## 2017-06-13 MED ORDER — MIDAZOLAM HCL 5 MG/5ML IJ SOLN
INTRAMUSCULAR | Status: DC | PRN
  Administered 2017-06-13 (×3): 2 mg via INTRAVENOUS
  Administered 2017-06-13: 1 mg via INTRAVENOUS

## 2017-06-13 MED ORDER — MEPERIDINE HCL 50 MG/ML IJ SOLN
INTRAMUSCULAR | Status: DC | PRN
  Administered 2017-06-13 (×2): 25 mg via INTRAVENOUS

## 2017-06-13 MED ORDER — FERROUS SULFATE 325 (65 FE) MG PO TABS: 325.0000 mg | ORAL_TABLET | Freq: Two times a day (BID) | ORAL | 3 refills | Status: DC

## 2017-06-13 MED ORDER — STERILE WATER FOR IRRIGATION IR SOLN
Status: DC | PRN
  Administered 2017-06-13: 100 mL

## 2017-06-13 MED ORDER — SODIUM CHLORIDE 0.9 % IV SOLN
INTRAVENOUS | Status: DC
  Administered 2017-06-13: 07:00:00 via INTRAVENOUS

## 2017-06-13 MED ORDER — MEPERIDINE HCL 50 MG/ML IJ SOLN
INTRAMUSCULAR | Status: AC
  Filled 2017-06-13: qty 1

## 2017-06-13 MED ORDER — MIDAZOLAM HCL 5 MG/5ML IJ SOLN
INTRAMUSCULAR | Status: AC
  Filled 2017-06-13: qty 10

## 2017-06-13 NOTE — H&P (Signed)
Brent West is an 42 y.o. male.   Chief Complaint: Patient is here for colonoscopy. HPI: Patient is 42 year old Caucasian male who was recently found to have iron deficiency anemia and heme positive stool.  He has been taking ibuprofen OTC daily.  His hemoglobin corrected from 7.8-10.7 with p.o. iron.  He underwent EGD a few weeks ago.  No bleeding lesion was identified.  He is now returning for diagnostic colonoscopy.  He denies abdominal pain nausea vomiting melena or rectal bleeding. He has been drastically cut back use of ibuprofen.  He may take 1 every 200 mg every now and then. Family history is negative for CRC IBD or celiac disease. He has not donated blood in the last 5 years.  Past Medical History:  Diagnosis Date  . Hypertension   . PONV (postoperative nausea and vomiting)     Past Surgical History:  Procedure Laterality Date  . ESOPHAGOGASTRODUODENOSCOPY N/A 05/25/2017   Procedure: ESOPHAGOGASTRODUODENOSCOPY (EGD);  Surgeon: Rogene Houston, MD;  Location: [facility];  Service: Endoscopy;  Laterality: N/A;  3:05  . FRACTURE SURGERY     right thumb pinned then pin removed  . KNEE ARTHROSCOPY     age 12, repeat age 72 left  . TONSILLECTOMY      Family History  Problem Relation Age of Onset  . Hypertension Mother   . Cancer Mother   . Hypertension Father   . Hypertension Sister    Social History:  reports that  has never smoked. he has never used smokeless tobacco. He reports that he does not drink alcohol or use drugs.  Allergies: No Known Allergies  Medications Prior to Admission  Medication Sig Dispense Refill  . diphenhydrAMINE (BENADRYL) 25 MG tablet Take 25 mg by mouth at bedtime as needed for allergies.     . ferrous sulfate 325 (65 FE) MG tablet Take 325 mg by mouth 3 (three) times daily.     Marland Kitchen ibuprofen (ADVIL) 200 MG tablet Take 2 tablets (400 mg total) by mouth 2 (two) times daily as needed. (Patient taking differently: Take 400 mg by mouth 2 (two)  times daily as needed for headache or moderate pain. ) 30 tablet 0  . lisinopril (PRINIVIL,ZESTRIL) 20 MG tablet Take 20 mg by mouth 2 (two) times daily.  3  . loratadine (CLARITIN) 10 MG tablet Take 10 mg by mouth daily.    . Multiple Vitamins-Minerals (ADULT GUMMY PO) Take 2 tablets by mouth daily.    . pantoprazole (PROTONIX) 40 MG tablet Take 1 tablet (40 mg total) by mouth daily before breakfast. 30 tablet 5    No results found for this or any previous visit (from the past 48 hour(s)). No results found.  ROS  Blood pressure 123/89, pulse 84, temperature 97.9 F (36.6 C), temperature source Oral, resp. rate 17, height 5\' 9"  (1.753 m), weight 215 lb (97.5 kg), SpO2 95 %. Physical Exam  Constitutional: He appears well-developed and well-nourished.  HENT:  Mouth/Throat: Oropharynx is clear and moist.  Eyes: Conjunctivae are normal. No scleral icterus.  Neck: No thyromegaly present.  Cardiovascular: Normal rate, regular rhythm and normal heart sounds.  No murmur heard. Respiratory: Effort normal and breath sounds normal.  GI: Soft. He exhibits no distension and no mass. There is no tenderness.  Musculoskeletal: He exhibits no edema.  Lymphadenopathy:    He has no cervical adenopathy.  Neurological: He is alert.  Skin: Skin is warm and dry.     Assessment/Plan Iron  deficiency anemia and heme positive stool. Diagnostic colonoscopy.  Hildred Laser, MD 06/13/2017, 7:31 AM

## 2017-06-13 NOTE — Discharge Instructions (Signed)
High-Fiber Diet Fiber, also called dietary fiber, is a type of carbohydrate found in fruits, vegetables, whole grains, and beans. A high-fiber diet can have many health benefits. Your health care provider may recommend a high-fiber diet to help:  Prevent constipation. Fiber can make your bowel movements more regular.  Lower your cholesterol.  Relieve hemorrhoids, uncomplicated diverticulosis, or irritable bowel syndrome.  Prevent overeating as part of a weight-loss plan.  Prevent heart disease, type 2 diabetes, and certain cancers.  What is my plan? The recommended daily intake of fiber includes:  38 grams for men under age 67.  31 grams for men over age 71.  61 grams for women under age 60.  32 grams for women over age 13.  You can get the recommended daily intake of dietary fiber by eating a variety of fruits, vegetables, grains, and beans. Your health care provider may also recommend a fiber supplement if it is not possible to get enough fiber through your diet. What do I need to know about a high-fiber diet?  Fiber supplements have not been widely studied for their effectiveness, so it is better to get fiber through food sources.  Always check the fiber content on thenutrition facts label of any prepackaged food. Look for foods that contain at least 5 grams of fiber per serving.  Ask your dietitian if you have questions about specific foods that are related to your condition, especially if those foods are not listed in the following section.  Increase your daily fiber consumption gradually. Increasing your intake of dietary fiber too quickly may cause bloating, cramping, or gas.  Drink plenty of water. Water helps you to digest fiber. What foods can I eat? Grains Whole-grain breads. Multigrain cereal. Oats and oatmeal. Brown rice. Barley. Bulgur wheat. [place]. Bran muffins. Popcorn. Rye wafer crackers. Vegetables Sweet potatoes. Spinach. Kale. Artichokes. Cabbage.  Broccoli. Green peas. Carrots. Squash. Fruits Berries. Pears. Apples. Oranges. Avocados. Prunes and raisins. Dried figs. Meats and Other Protein Sources Navy, kidney, pinto, and soy beans. Split peas. Lentils. Nuts and seeds. Dairy Fiber-fortified yogurt. Beverages Fiber-fortified soy milk. Fiber-fortified orange juice. Other Fiber bars. The items listed above may not be a complete list of recommended foods or beverages. Contact your dietitian for more options. What foods are not recommended? Grains White bread. Pasta made with refined flour. White rice. Vegetables Fried potatoes. Canned vegetables. Well-cooked vegetables. Fruits Fruit juice. Cooked, strained fruit. Meats and Other Protein Sources Fatty cuts of meat. Fried Sales executive or fried fish. Dairy Milk. Yogurt. Cream cheese. Sour cream. Beverages Soft drinks. Other Cakes and pastries. Butter and oils. The items listed above may not be a complete list of foods and beverages to avoid. Contact your dietitian for more information. What are some tips for including high-fiber foods in my diet?  Eat a wide variety of high-fiber foods.  Make sure that half of all grains consumed each day are whole grains.  Replace breads and cereals made from refined flour or white flour with whole-grain breads and cereals.  Replace white rice with brown rice, bulgur wheat, or millet.  Start the day with a breakfast that is high in fiber, such as a cereal that contains at least 5 grams of fiber per serving.  Use beans in place of meat in soups, salads, or pasta.  Eat high-fiber snacks, such as berries, raw vegetables, nuts, or popcorn. This information is not intended to replace advice given to you by your health care provider. Make sure you discuss any  questions you have with your health care provider. Document Released: 04/05/2005 Document Revised: 09/11/2015 Document Reviewed: 09/18/2013 Elsevier Interactive Patient Education  2018  [employer]. Hemorrhoids Hemorrhoids are swollen veins in and around the rectum or anus. There are two types of hemorrhoids:  Internal hemorrhoids. These occur in the veins that are just inside the rectum. They may poke through to the outside and become irritated and painful.  External hemorrhoids. These occur in the veins that are outside of the anus and can be felt as a painful swelling or hard lump near the anus.  Most hemorrhoids do not cause serious problems, and they can be managed with home treatments such as diet and lifestyle changes. If home treatments do not help your symptoms, procedures can be done to shrink or remove the hemorrhoids. What are the causes? This condition is caused by increased pressure in the anal area. This pressure may result from various things, including:  Constipation.  Straining to have a bowel movement.  Diarrhea.  Pregnancy.  Obesity.  Sitting for long periods of time.  Heavy lifting or other activity that causes you to strain.  Anal sex.  What are the signs or symptoms? Symptoms of this condition include:  Pain.  Anal itching or irritation.  Rectal bleeding.  Leakage of stool (feces).  Anal swelling.  One or more lumps around the anus.  How is this diagnosed? This condition can often be diagnosed through a visual exam. Other exams or tests may also be done, such as:  Examination of the rectal area with a gloved hand (digital rectal exam).  Examination of the anal canal using a small tube (anoscope).  A blood test, if you have lost a significant amount of blood.  A test to look inside the colon (sigmoidoscopy or colonoscopy).  How is this treated? This condition can usually be treated at home. However, various procedures may be done if dietary changes, lifestyle changes, and other home treatments do not help your symptoms. These procedures can help make the hemorrhoids smaller or remove them completely. Some of these  procedures involve surgery, and others do not. Common procedures include:  Rubber band ligation. Rubber bands are placed at the base of the hemorrhoids to cut off the blood supply to them.  Sclerotherapy. Medicine is injected into the hemorrhoids to shrink them.  Infrared coagulation. A type of light energy is used to get rid of the hemorrhoids.  Hemorrhoidectomy surgery. The hemorrhoids are surgically removed, and the veins that supply them are tied off.  Stapled hemorrhoidopexy surgery. A circular stapling device is used to remove the hemorrhoids and use staples to cut off the blood supply to them.  Follow these instructions at home: Eating and drinking  Eat foods that have a lot of fiber in them, such as whole grains, beans, nuts, fruits, and vegetables. Ask your health care provider about taking products that have added fiber (fiber supplements).  Drink enough fluid to keep your urine clear or pale yellow. Managing pain and swelling  Take warm sitz baths for 20 minutes, 3-4 times a day to ease pain and discomfort.  If directed, apply ice to the affected area. Using ice packs between sitz baths may be helpful. ? Put ice in a plastic bag. ? Place a towel between your skin and the bag. ? Leave the ice on for 20 minutes, 2-3 times a day. General instructions  Take over-the-counter and prescription medicines only as told by your health care provider.  Use medicated  creams or suppositories as told.  Exercise regularly.  Go to the bathroom when you have the urge to have a bowel movement. Do not wait.  Avoid straining to have bowel movements.  Keep the anal area dry and clean. Use wet toilet paper or moist towelettes after a bowel movement.  Do not sit on the toilet for long periods of time. This increases blood pooling and pain. Contact a health care provider if:  You have increasing pain and swelling that are not controlled by treatment or medicine.  You have uncontrolled  bleeding.  You have difficulty having a bowel movement, or you are unable to have a bowel movement.  You have pain or inflammation outside the area of the hemorrhoids. This information is not intended to replace advice given to you by your health care provider. Make sure you discuss any questions you have with your health care provider. Document Released: 04/02/2000 Document Revised: 09/03/2015 Document Reviewed: 12/18/2014 Elsevier Interactive Patient Education  2018 [employer]. Colonoscopy, Adult, Care After This sheet gives you information about how to care for yourself after your procedure. Your health care provider may also give you more specific instructions. If you have problems or questions, contact your health care provider. What can I expect after the procedure? After the procedure, it is common to have:  A small amount of blood in your stool for 24 hours after the procedure.  Some gas.  Mild abdominal cramping or bloating.  Follow these instructions at home: General instructions   For the first 24 hours after the procedure: ? Do not drive or use machinery. ? Do not sign important documents. ? Do not drink alcohol. ? Do your regular daily activities at a slower pace than normal. ? Eat soft, easy-to-digest foods. ? Rest often.  Take over-the-counter or prescription medicines only as told by your health care provider.  It is up to you to get the results of your procedure. Ask your health care provider, or the department performing the procedure, when your results will be ready. Relieving cramping and bloating  Try walking around when you have cramps or feel bloated.  Apply heat to your abdomen as told by your health care provider. Use a heat source that your health care provider recommends, such as a moist heat pack or a heating pad. ? Place a towel between your skin and the heat source. ? Leave the heat on for 20-30 minutes. ? Remove the heat if your skin turns  bright red. This is especially important if you are unable to feel pain, heat, or cold. You may have a greater risk of getting burned. Eating and drinking  Drink enough fluid to keep your urine clear or pale yellow.  Resume your normal diet as instructed by your health care provider. Avoid heavy or fried foods that are hard to digest.  Avoid drinking alcohol for as long as instructed by your health care provider. Contact a health care provider if:  You have blood in your stool 2-3 days after the procedure. Get help right away if:  You have more than a small spotting of blood in your stool.  You pass large blood clots in your stool.  Your abdomen is swollen.  You have nausea or vomiting.  You have a fever.  You have increasing abdominal pain that is not relieved with medicine. This information is not intended to replace advice given to you by your health care provider. Make sure you discuss any questions you have  with your health care provider. Document Released: 11/18/2003 Document Revised: 12/29/2015 Document Reviewed: 06/17/2015 Elsevier Interactive Patient Education  2018 [employer]. Colon Polyps Polyps are tissue growths inside the body. Polyps can grow in many places, including the large intestine (colon). A polyp may be a round bump or a mushroom-shaped growth. You could have one polyp or several. Most colon polyps are noncancerous (benign). However, some colon polyps can become cancerous over time. What are the causes? The exact cause of colon polyps is not known. What increases the risk? This condition is more likely to develop in people who:  Have a family history of colon cancer or colon polyps.  Are older than 84 or older than 45 if they are African American.  Have inflammatory bowel disease, such as ulcerative colitis or Crohn disease.  Are overweight.  Smoke cigarettes.  Do not get enough exercise.  Drink too much alcohol.  Eat a diet that is: ? High  in fat and red meat. ? Low in fiber.  Had childhood cancer that was treated with abdominal radiation.  What are the signs or symptoms? Most polyps do not cause symptoms. If you have symptoms, they may include:  Blood coming from your rectum when having a bowel movement.  Blood in your stool.The stool may look dark red or black.  A change in bowel habits, such as constipation or diarrhea.  How is this diagnosed? This condition is diagnosed with a colonoscopy. This is a procedure that uses a lighted, flexible scope to look at the inside of your colon. How is this treated? Treatment for this condition involves removing any polyps that are found. Those polyps will then be tested for cancer. If cancer is found, your health care provider will talk to you about options for colon cancer treatment. Follow these instructions at home: Diet  Eat plenty of fiber, such as fruits, vegetables, and whole grains.  Eat foods that are high in calcium and vitamin D, such as milk, cheese, yogurt, eggs, liver, fish, and broccoli.  Limit foods high in fat, red meats, and processed meats, such as hot dogs, sausage, bacon, and lunch meats.  Maintain a healthy weight, or lose weight if recommended by your health care provider. General instructions  Do not smoke cigarettes.  Do not drink alcohol excessively.  Keep all follow-up visits as told by your health care provider. This is important. This includes keeping regularly scheduled colonoscopies. Talk to your health care provider about when you need a colonoscopy.  Exercise every day or as told by your health care provider. Contact a health care provider if:  You have new or worsening bleeding during a bowel movement.  You have new or increased blood in your stool.  You have a change in bowel habits.  You unexpectedly lose weight. This information is not intended to replace advice given to you by your health care provider. Make sure you discuss  any questions you have with your health care provider. Document Released: 12/31/2003 Document Revised: 09/11/2015 Document Reviewed: 02/24/2015 Elsevier Interactive Patient Education  2018 [employer]. Resume ferrous sulfate 325 mg by mouth twice daily. Keep ibuprofen use to minimum. Resume other medications as before. High-fiber diet. No driving for 24 hours. Physician will call with results of  Biopsy.

## 2017-06-13 NOTE — Op Note (Signed)
[facility] Patient Name: Brent West Procedure Date: 06/13/2017 7:03 AM MRN: [id number] Date of Birth: Jan 11, 1976 Attending MD: Hildred Laser , MD CSN: [id number] Age: 42 Admit Type: Outpatient Procedure:                Colonoscopy Indications:              Iron deficiency anemia secondary to chronic blood                            loss, Iron deficiency anemia Providers:                Hildred Laser, MD, Rosina Lowenstein, RN, Aram Candela Referring MD:             Halford Chessman, MD Medicines:                Meperidine 50 mg IV, Midazolam 7 mg IV Complications:            No immediate complications. Estimated Blood Loss:     Estimated blood loss was minimal. Procedure:                Pre-Anesthesia Assessment:                           - Prior to the procedure, a History and Physical                            was performed, and patient medications and                            allergies were reviewed. The patient's tolerance of                            previous anesthesia was also reviewed. The risks                            and benefits of the procedure and the sedation                            options and risks were discussed with the patient.                            All questions were answered, and informed consent                            was obtained. Prior Anticoagulants: The patient                            last took ibuprofen 2 days prior to the procedure.                            ASA Grade Assessment: I - A normal, healthy                            patient. After reviewing the risks and benefits,  the patient was deemed in satisfactory condition to                            undergo the procedure.                           After obtaining informed consent, the colonoscope                            was passed under direct vision. Throughout the                            procedure, the patient's blood pressure, pulse, and                           oxygen saturations were monitored continuously. The                            [id number] ([id number]) was introduced through the                            anus and advanced to the the terminal ileum, with                            identification of the appendiceal orifice and IC                            valve. The colonoscopy was performed without                            difficulty. The patient tolerated the procedure                            well. The quality of the bowel preparation was                            excellent. The terminal ileum, ileocecal valve,                            appendiceal orifice, and rectum were photographed. Scope In: 7:42:34 AM Scope Out: 8:04:47 AM Scope Withdrawal Time: 0 hours 9 minutes 44 seconds  Total Procedure Duration: 0 hours 22 minutes 13 seconds  Findings:      The perianal and digital rectal examinations were normal.      The terminal ileum appeared normal.      Scattered small and large-mouthed diverticula were found in the sigmoid       colon, hepatic flexure and ascending colon.      Five sessile polyps were found in the sigmoid colon, transverse colon       and hepatic flexure. The polyps were diminutive in size. These were       biopsied with a cold forceps for histology.      Internal hemorrhoids were found during retroflexion. The hemorrhoids       were small. Impression:               -  The examined portion of the ileum was normal.                           - Diverticulosis in the sigmoid colon, at the                            hepatic flexure and in the ascending colon.                           - Five diminutive polyps in the sigmoid colon, in                            the transverse colon and at the hepatic flexure.                            Biopsied.                           - Small internal hemorrhoids. Moderate Sedation:      Moderate (conscious) sedation was administered by the endoscopy nurse        and supervised by the endoscopist. The following parameters were       monitored: oxygen saturation, heart rate, blood pressure, CO2       capnography and response to care. Total physician intraservice time was       18 minutes. Recommendation:           - Patient has a contact number available for                            emergencies. The signs and symptoms of potential                            delayed complications were discussed with the                            patient. Return to normal activities tomorrow.                            Written discharge instructions were provided to the                            patient.                           - High fiber diet today.                           - Continue present medications including ferrous                            sulfate.                           - Await pathology results.                           -  Repeat colonoscopy in 10 years for screening                            purposes.                           - Check hemoglobin and hematocrit today. Procedure Code(s):        --- Professional ---                           [phone], Colonoscopy, flexible; with biopsy, single                            or multiple                           99152, Moderate sedation services provided by the                            same physician or other qualified health care                            professional performing the diagnostic or                            therapeutic service that the sedation supports,                            requiring the presence of an independent trained                            observer to assist in the monitoring of the                            patient's level of consciousness and physiological                            status; initial 15 minutes of intraservice time,                            patient age 38 years or older Diagnosis Code(s):        --- Professional ---                            D12.5, Benign neoplasm of sigmoid colon                           D12.3, Benign neoplasm of transverse colon (hepatic                            flexure or splenic flexure)                           D50.0, Iron deficiency anemia secondary to blood  loss (chronic)                           D50.9, Iron deficiency anemia, unspecified                           K57.30, Diverticulosis of large intestine without                            perforation or abscess without bleeding CPT copyright 2016 [employer]. All rights reserved. The codes documented in this report are preliminary and upon coder review may  be revised to meet current compliance requirements. Hildred Laser, MD Hildred Laser, MD 06/13/2017 8:20:12 AM This report has been signed electronically. Number of Addenda: 0

## 2017-06-14 ENCOUNTER — Encounter (HOSPITAL_COMMUNITY): Payer: Self-pay | Admitting: Internal Medicine

## 2017-06-16 MED FILL — LISINOPRIL 20 MG TABLET: 20 | 90 days supply | Qty: 180 | Fill #2

## 2017-06-20 ENCOUNTER — Other Ambulatory Visit (INDEPENDENT_AMBULATORY_CARE_PROVIDER_SITE_OTHER): Payer: Self-pay | Admitting: *Deleted

## 2017-06-20 DIAGNOSIS — D649 Anemia, unspecified: Secondary | ICD-10-CM

## 2017-06-20 DIAGNOSIS — R195 Other fecal abnormalities: Secondary | ICD-10-CM

## 2017-06-20 DIAGNOSIS — D508 Other iron deficiency anemias: Secondary | ICD-10-CM

## 2017-06-28 ENCOUNTER — Ambulatory Visit: Payer: 59 | Admitting: Cardiology

## 2017-06-28 ENCOUNTER — Encounter: Payer: Self-pay | Admitting: Cardiology

## 2017-06-28 VITALS — BP 126/84 | HR 83 | Ht 69.0 in | Wt 229.0 lb

## 2017-06-28 DIAGNOSIS — R0789 Other chest pain: Secondary | ICD-10-CM | POA: Diagnosis not present

## 2017-06-28 NOTE — Progress Notes (Signed)
Clinical Summary Mr. Kann is a 42 y.o.male seen as new consult, referred by Dr Hilma Favors or chest pain  1. Chest pain -sharp midchest down left arm, 5/10 in severity. Started while cutting a tree and hauling. +SOB. Dizzy. Not positional.  - pain lasted about 1 hour until came to ER. Self resolved. Since that time has been limiting activity.  - ER visit Jan 2019. Trops neg x 2  CAD risk: HTN, no family history of premature CAD    2. Anemia - Hgb of 7.9 in Jan 2019 - followed by GI  Past Medical History:  Diagnosis Date  . Hypertension   . PONV (postoperative nausea and vomiting)      No Known Allergies   Current Outpatient Medications  Medication Sig Dispense Refill  . diphenhydrAMINE (BENADRYL) 25 MG tablet Take 25 mg by mouth at bedtime as needed for allergies.     . ferrous sulfate 325 (65 FE) MG tablet Take 1 tablet (325 mg total) by mouth 2 (two) times daily with a meal.  3  . ibuprofen (ADVIL) 200 MG tablet Take 2 tablets (400 mg total) by mouth 2 (two) times daily as needed. (Patient taking differently: Take 400 mg by mouth 2 (two) times daily as needed for headache or moderate pain. ) 30 tablet 0  . lisinopril (PRINIVIL,ZESTRIL) 20 MG tablet Take 20 mg by mouth 2 (two) times daily.  3  . loratadine (CLARITIN) 10 MG tablet Take 10 mg by mouth daily.    . Multiple Vitamins-Minerals (ADULT GUMMY PO) Take 2 tablets by mouth daily.    . pantoprazole (PROTONIX) 40 MG tablet Take 1 tablet (40 mg total) by mouth daily before breakfast. 30 tablet 5   No current facility-administered medications for this visit.      Past Surgical History:  Procedure Laterality Date  . COLONOSCOPY N/A 06/13/2017   Procedure: COLONOSCOPY;  Surgeon: Rogene Houston, MD;  Location: [facility];  Service: Endoscopy;  Laterality: N/A;  730  . ESOPHAGOGASTRODUODENOSCOPY N/A 05/25/2017   Procedure: ESOPHAGOGASTRODUODENOSCOPY (EGD);  Surgeon: Rogene Houston, MD;  Location: [facility];   Service: Endoscopy;  Laterality: N/A;  3:05  . FRACTURE SURGERY     right thumb pinned then pin removed  . KNEE ARTHROSCOPY     age 12, repeat age 21 left  . TONSILLECTOMY       No Known Allergies    Family History  Problem Relation Age of Onset  . Hypertension Mother   . Cancer Mother   . Hypertension Father   . Hypertension Sister      Social History Mr. Penza reports that  has never smoked. he has never used smokeless tobacco. Mr. Korte reports that he does not drink alcohol.   Review of Systems CONSTITUTIONAL: No weight loss, fever, chills, weakness or fatigue.  HEENT: Eyes: No visual loss, blurred vision, double vision or yellow sclerae.No hearing loss, sneezing, congestion, runny nose or sore throat.  SKIN: No rash or itching.  CARDIOVASCULAR: RRR, no m//rg, no jvd RESPIRATORY: No shortness of breath, cough or sputum.  GASTROINTESTINAL: No anorexia, nausea, vomiting or diarrhea. No abdominal pain or blood.  GENITOURINARY: No burning on urination, no polyuria NEUROLOGICAL: No headache, dizziness, syncope, paralysis, ataxia, numbness or tingling in the extremities. No change in bowel or bladder control.  MUSCULOSKELETAL: No muscle, back pain, joint pain or stiffness.  LYMPHATICS: No enlarged nodes. No history of splenectomy.  PSYCHIATRIC: No history of depression or  anxiety.  ENDOCRINOLOGIC: No reports of sweating, cold or heat intolerance. No polyuria or polydipsia.  Marland Kitchen   Physical Examination Vitals:   06/28/17 0837 06/28/17 0839  BP: 122/80 126/84  Pulse: 83   SpO2: 97%    Vitals:   06/28/17 0837  Weight: 229 lb (103.9 kg)  Height: 5\' 9"  (1.753 m)    Gen: resting comfortably, no acute distress HEENT: no scleral icterus, pupils equal round and reactive, no palptable cervical adenopathy,  CV Resp: Clear to auscultation bilaterally GI: abdomen is soft, non-tender, non-distended, normal bowel sounds, no hepatosplenomegaly MSK: extremities are warm, no  edema.  Skin: warm, no rash Neuro:  no focal deficits Psych: appropriate affect     Assessment and Plan  1. Chest pain - isolated episode of fairly atypical chest pain, minimal CAD risk factors - continue to monitor at this time, if recurrences of symptoms would consider ischemic testing at that time.   F/u 6 months. He is to call if recurrence of symptoms.     Arnoldo Lenis, M.D.

## 2017-06-28 NOTE — Patient Instructions (Signed)
Medication Instructions:  Your physician recommends that you continue on your current medications as directed. Please refer to the Current Medication list given to you today.   Labwork: NONE  Testing/Procedures: NONE  Follow-Up: Your physician wants you to follow-up in: 6 MONTHS.  You will receive a reminder letter in the mail two months in advance. If you don't receive a letter, please call our office to schedule the follow-up appointment.   Any Other Special Instructions Will Be Listed Below (If Applicable).     If you need a refill on your cardiac medications before your next appointment, please call your pharmacy.   DASH Eating Plan DASH stands for "Dietary Approaches to Stop Hypertension." The DASH eating plan is a healthy eating plan that has been shown to reduce high blood pressure (hypertension). It may also reduce your risk for type 2 diabetes, heart disease, and stroke. The DASH eating plan may also help with weight loss. What are tips for following this plan? General guidelines  Avoid eating more than 2,300 mg (milligrams) of salt (sodium) a day. If you have hypertension, you may need to reduce your sodium intake to 1,500 mg a day.  Limit alcohol intake to no more than 1 drink a day for nonpregnant women and 2 drinks a day for men. One drink equals 12 oz of beer, 5 oz of wine, or 1 oz of hard liquor.  Work with your health care provider to maintain a healthy body weight or to lose weight. Ask what an ideal weight is for you.  Get at least 30 minutes of exercise that causes your heart to beat faster (aerobic exercise) most days of the week. Activities may include walking, swimming, or biking.  Work with your health care provider or diet and nutrition specialist (dietitian) to adjust your eating plan to your individual calorie needs. Reading food labels  Check food labels for the amount of sodium per serving. Choose foods with less than 5 percent of the Daily Value of  sodium. Generally, foods with less than 300 mg of sodium per serving fit into this eating plan.  To find whole grains, look for the word "whole" as the first word in the ingredient list. Shopping  Buy products labeled as "low-sodium" or "no salt added."  Buy fresh foods. Avoid canned foods and premade or frozen meals. Cooking  Avoid adding salt when cooking. Use salt-free seasonings or herbs instead of table salt or sea salt. Check with your health care provider or pharmacist before using salt substitutes.  Do not fry foods. Cook foods using healthy methods such as baking, boiling, grilling, and broiling instead.  Cook with heart-healthy oils, such as olive, canola, soybean, or sunflower oil. Meal planning   Eat a balanced diet that includes: ? 5 or more servings of fruits and vegetables each day. At each meal, try to fill half of your plate with fruits and vegetables. ? Up to 6-8 servings of whole grains each day. ? Less than 6 oz of lean meat, poultry, or fish each day. A 3-oz serving of meat is about the same size as a deck of cards. One egg equals 1 oz. ? 2 servings of low-fat dairy each day. ? A serving of nuts, seeds, or beans 5 times each week. ? Heart-healthy fats. Healthy fats called Omega-3 fatty acids are found in foods such as flaxseeds and coldwater fish, like sardines, salmon, and mackerel.  Limit how much you eat of the following: ? Canned or prepackaged foods. ?  Food that is high in trans fat, such as fried foods. ? Food that is high in saturated fat, such as fatty meat. ? Sweets, desserts, sugary drinks, and other foods with added sugar. ? Full-fat dairy products.  Do not salt foods before eating.  Try to eat at least 2 vegetarian meals each week.  Eat more home-cooked food and less restaurant, buffet, and fast food.  When eating at a restaurant, ask that your food be prepared with less salt or no salt, if possible. What foods are recommended? The items  listed may not be a complete list. Talk with your dietitian about what dietary choices are best for you. Grains Whole-grain or whole-wheat bread. Whole-grain or whole-wheat pasta. Brown rice. [place] Morrow. Bulgur. Whole-grain and low-sodium cereals. Pita bread. Low-fat, low-sodium crackers. Whole-wheat flour tortillas. Vegetables Fresh or frozen vegetables (raw, steamed, roasted, or grilled). Low-sodium or reduced-sodium tomato and vegetable juice. Low-sodium or reduced-sodium tomato sauce and tomato paste. Low-sodium or reduced-sodium canned vegetables. Fruits All fresh, dried, or frozen fruit. Canned fruit in natural juice (without added sugar). Meat and other protein foods Skinless chicken or [place]. Ground chicken or [place]. Pork with fat trimmed off. Fish and seafood. Egg whites. Dried beans, peas, or lentils. Unsalted nuts, nut butters, and seeds. Unsalted canned beans. Lean cuts of beef with fat trimmed off. Low-sodium, lean deli meat. Dairy Low-fat (1%) or fat-free (skim) milk. Fat-free, low-fat, or reduced-fat cheeses. Nonfat, low-sodium ricotta or cottage cheese. Low-fat or nonfat yogurt. Low-fat, low-sodium cheese. Fats and oils Soft margarine without trans fats. Vegetable oil. Low-fat, reduced-fat, or light mayonnaise and salad dressings (reduced-sodium). Canola, safflower, olive, soybean, and sunflower oils. Avocado. Seasoning and other foods Herbs. Spices. Seasoning mixes without salt. Unsalted popcorn and pretzels. Fat-free sweets. What foods are not recommended? The items listed may not be a complete list. Talk with your dietitian about what dietary choices are best for you. Grains Baked goods made with fat, such as croissants, muffins, or some breads. Dry pasta or rice meal packs. Vegetables Creamed or fried vegetables. Vegetables in a cheese sauce. Regular canned vegetables (not low-sodium or reduced-sodium). Regular canned tomato sauce and paste (not low-sodium or  reduced-sodium). Regular tomato and vegetable juice (not low-sodium or reduced-sodium). Angie Fava. Olives. Fruits Canned fruit in a light or heavy syrup. Fried fruit. Fruit in cream or butter sauce. Meat and other protein foods Fatty cuts of meat. Ribs. Fried meat. Berniece Salines. Sausage. Bologna and other processed lunch meats. Salami. Fatback. Hotdogs. Bratwurst. Salted nuts and seeds. Canned beans with added salt. Canned or smoked fish. Whole eggs or egg yolks. Chicken or [place] with skin. Dairy Whole or 2% milk, cream, and half-and-half. Whole or full-fat cream cheese. Whole-fat or sweetened yogurt. Full-fat cheese. Nondairy creamers. Whipped toppings. Processed cheese and cheese spreads. Fats and oils Butter. Stick margarine. Lard. Shortening. Ghee. Bacon fat. Tropical oils, such as coconut, palm kernel, or palm oil. Seasoning and other foods Salted popcorn and pretzels. Onion salt, garlic salt, seasoned salt, table salt, and sea salt. Worcestershire sauce. Tartar sauce. Barbecue sauce. Teriyaki sauce. Soy sauce, including reduced-sodium. Steak sauce. Canned and packaged gravies. Fish sauce. Oyster sauce. Cocktail sauce. Horseradish that you find on the shelf. Ketchup. Mustard. Meat flavorings and tenderizers. Bouillon cubes. Hot sauce and Tabasco sauce. Premade or packaged marinades. Premade or packaged taco seasonings. Relishes. Regular salad dressings. Where to find more information:  National Heart, Lung, and [place]: https://wilson-eaton.com/  American Heart Association: www.heart.org Summary  The DASH eating plan is  a healthy eating plan that has been shown to reduce high blood pressure (hypertension). It may also reduce your risk for type 2 diabetes, heart disease, and stroke.  With the DASH eating plan, you should limit salt (sodium) intake to 2,300 mg a day. If you have hypertension, you may need to reduce your sodium intake to 1,500 mg a day.  When on the DASH eating plan, aim to eat more  fresh fruits and vegetables, whole grains, lean proteins, low-fat dairy, and heart-healthy fats.  Work with your health care provider or diet and nutrition specialist (dietitian) to adjust your eating plan to your individual calorie needs. This information is not intended to replace advice given to you by your health care provider. Make sure you discuss any questions you have with your health care provider. Document Released: 03/25/2011 Document Revised: 03/29/2016 Document Reviewed: 03/29/2016 Elsevier Interactive Patient Education  [employer].

## 2017-07-01 ENCOUNTER — Encounter: Payer: Self-pay | Admitting: Cardiology

## 2017-08-11 ENCOUNTER — Other Ambulatory Visit (INDEPENDENT_AMBULATORY_CARE_PROVIDER_SITE_OTHER): Payer: Self-pay | Admitting: *Deleted

## 2017-08-11 ENCOUNTER — Encounter (INDEPENDENT_AMBULATORY_CARE_PROVIDER_SITE_OTHER): Payer: Self-pay | Admitting: *Deleted

## 2017-08-11 DIAGNOSIS — D508 Other iron deficiency anemias: Secondary | ICD-10-CM

## 2017-08-11 DIAGNOSIS — R195 Other fecal abnormalities: Secondary | ICD-10-CM

## 2017-08-11 DIAGNOSIS — D649 Anemia, unspecified: Secondary | ICD-10-CM

## 2017-08-22 DIAGNOSIS — R195 Other fecal abnormalities: Secondary | ICD-10-CM | POA: Diagnosis not present

## 2017-08-22 DIAGNOSIS — D508 Other iron deficiency anemias: Secondary | ICD-10-CM | POA: Diagnosis not present

## 2017-08-22 DIAGNOSIS — D649 Anemia, unspecified: Secondary | ICD-10-CM | POA: Diagnosis not present

## 2017-08-23 LAB — HEMOGLOBIN AND HEMATOCRIT, BLOOD
HEMATOCRIT: 47.3 % (ref 38.5–50.0)
Hemoglobin: 16.2 g/dL (ref 13.2–17.1)

## 2017-09-16 MED FILL — LISINOPRIL 20 MG TABLET: 20 | 90 days supply | Qty: 180 | Fill #0

## 2017-09-27 DIAGNOSIS — Z6833 Body mass index (BMI) 33.0-33.9, adult: Secondary | ICD-10-CM | POA: Diagnosis not present

## 2017-09-27 DIAGNOSIS — Z1389 Encounter for screening for other disorder: Secondary | ICD-10-CM | POA: Diagnosis not present

## 2017-09-27 DIAGNOSIS — E6609 Other obesity due to excess calories: Secondary | ICD-10-CM | POA: Diagnosis not present

## 2017-09-27 DIAGNOSIS — Z0000 Encounter for general adult medical examination without abnormal findings: Secondary | ICD-10-CM | POA: Diagnosis not present

## 2017-09-27 DIAGNOSIS — I10 Essential (primary) hypertension: Secondary | ICD-10-CM | POA: Diagnosis not present

## 2017-10-10 ENCOUNTER — Emergency Department (HOSPITAL_COMMUNITY): Payer: PRIVATE HEALTH INSURANCE

## 2017-10-10 ENCOUNTER — Emergency Department (HOSPITAL_COMMUNITY)
Admission: EM | Admit: 2017-10-10 | Discharge: 2017-10-10 | Disposition: A | Discharge status: Home / Self Care | Payer: PRIVATE HEALTH INSURANCE | Attending: Physician Assistant | Admitting: Physician Assistant

## 2017-10-10 ENCOUNTER — Encounter (HOSPITAL_COMMUNITY): Payer: Self-pay | Admitting: Emergency Medicine

## 2017-10-10 DIAGNOSIS — Y990 Civilian activity done for income or pay: Secondary | ICD-10-CM | POA: Insufficient documentation

## 2017-10-10 DIAGNOSIS — S60222A Contusion of left hand, initial encounter: Secondary | ICD-10-CM | POA: Diagnosis not present

## 2017-10-10 DIAGNOSIS — I10 Essential (primary) hypertension: Secondary | ICD-10-CM | POA: Insufficient documentation

## 2017-10-10 DIAGNOSIS — W230XXA Caught, crushed, jammed, or pinched between moving objects, initial encounter: Secondary | ICD-10-CM | POA: Diagnosis not present

## 2017-10-10 DIAGNOSIS — Y939 Activity, unspecified: Secondary | ICD-10-CM | POA: Insufficient documentation

## 2017-10-10 DIAGNOSIS — Y92239 Unspecified place in hospital as the place of occurrence of the external cause: Secondary | ICD-10-CM | POA: Diagnosis not present

## 2017-10-10 DIAGNOSIS — Z79899 Other long term (current) drug therapy: Secondary | ICD-10-CM | POA: Diagnosis not present

## 2017-10-10 MED ORDER — MELOXICAM 15 MG PO TABS: 15.0000 mg | ORAL_TABLET | Freq: Every day | ORAL | 0 refills | Status: DC

## 2017-10-10 MED ORDER — IBUPROFEN 800 MG PO TABS
800.0000 mg | ORAL_TABLET | Freq: Once | ORAL | Status: AC
  Administered 2017-10-10: 800 mg via ORAL
  Filled 2017-10-10: qty 1

## 2017-10-10 NOTE — ED Notes (Signed)
Patient transported to X-ray 

## 2017-10-10 NOTE — ED Notes (Signed)
Ortho paged. 

## 2017-10-10 NOTE — ED Provider Notes (Signed)
[facility]ENCY DEPARTMENT Provider Note   CSN: [id number] Arrival date & time: 10/10/17  1149     History   Chief Complaint Chief Complaint  Patient presents with  . Hand Pain    HPI Brent West is a 42 y.o. male the emergency department chief complaint of right hand injury.  Patient is an employee here at [employer].  He states that he was closing a door when his left hand was caught in it.  He is right-hand dominant.  He complains of pain and swelling over the thenar eminence of the right hand.  He denies numbness or tingling.  He denies wrist pain.  HPI  Past Medical History:  Diagnosis Date  . Hypertension   . PONV (postoperative nausea and vomiting)     Patient Active Problem List   Diagnosis Date Noted  . Absolute anemia 05/26/2017  . Iron deficiency anemia due to chronic blood loss 05/20/2017  . Guaiac positive stools 05/20/2017  . Blood pressure elevated 10/12/2011    Past Surgical History:  Procedure Laterality Date  . COLONOSCOPY N/A 06/13/2017   Procedure: COLONOSCOPY;  Surgeon: Rogene Houston, MD;  [facility]: [facility];  Service: Endoscopy;  Laterality: N/A;  730  . ESOPHAGOGASTRODUODENOSCOPY N/A 05/25/2017   Procedure: ESOPHAGOGASTRODUODENOSCOPY (EGD);  Surgeon: Rogene Houston, MD;  Location: [facility];  Service: Endoscopy;  Laterality: N/A;  3:05  . FRACTURE SURGERY     right thumb pinned then pin removed  . KNEE ARTHROSCOPY     age 25, repeat age 83 left  . TONSILLECTOMY          Home Medications    Prior to Admission medications   Medication Sig Start Date End Date Taking? Authorizing Provider  diphenhydrAMINE (BENADRYL) 25 MG tablet Take 25 mg by mouth at bedtime as needed for allergies.     [provider]  ibuprofen (ADVIL) 200 MG tablet Take 2 tablets (400 mg total) by mouth 2 (two) times daily as needed. Patient taking differently: Take 400 mg by mouth 2 (two) times daily as needed for  headache or moderate pain.  05/25/17   Rehman, Mechele Dawley, MD  lisinopril (PRINIVIL,ZESTRIL) 20 MG tablet Take 20 mg by mouth 2 (two) times daily. 02/21/17   [provider]  loratadine (CLARITIN) 10 MG tablet Take 10 mg by mouth daily.    [provider]  Multiple Vitamins-Minerals (ADULT GUMMY PO) Take 2 tablets by mouth daily.    [provider]    Family History Family History  Problem Relation Age of Onset  . Hypertension Mother   . Cancer Mother   . Hypertension Father   . Hypertension Sister     Social History Social History   Tobacco Use  . Smoking status: Never Smoker  . Smokeless tobacco: Never Used  Substance Use Topics  . Alcohol use: No  . Drug use: No     Allergies   Patient has no known allergies.   Review of Systems Review of Systems  Positive for joint pain and swelling, negative for bruising or wound Physical Exam Updated Vital Signs BP (!) 117/99 (BP Location: Right Arm)   Pulse 87   Temp 98.2 F (36.8 C) (Oral)   Resp 17   SpO2 100%   Physical Exam  Physical Exam  Nursing note and vitals reviewed. Constitutional: He appears well-developed and well-nourished. No distress.  HENT:  Head: Normocephalic and atraumatic.  Eyes: Conjunctivae normal are  normal. No scleral icterus.  Neck: Normal range of motion. Neck supple.  Cardiovascular: Normal rate, regular rhythm and normal heart sounds.   Pulmonary/Chest: Effort normal and breath sounds normal. No respiratory distress.  Abdominal: Soft. There is no tenderness.  Musculoskeletal: He exhibits no edema.  Left thenar eminence with hematoma and tenderness.  Full range of motion of the M CP and PIP.  Normal strength. Neurological: He is alert.  Skin: Skin is warm and dry. He is not diaphoretic.  Psychiatric: His behavior is normal.    ED Treatments / Results  Labs (all labs ordered are listed, but only abnormal results are displayed) Labs Reviewed - No data to  display  EKG None  Radiology Dg Hand Complete Left  Result Date: 10/10/2017 CLINICAL DATA:  Hand caught in door with pain, initial encounter EXAM: LEFT HAND - COMPLETE 3+ VIEW COMPARISON:  None. FINDINGS: There is no evidence of fracture or dislocation. There is no evidence of arthropathy or other focal bone abnormality. Soft tissues are unremarkable. IMPRESSION: No acute fracture is noted. Electronically Signed   By: Inez Catalina M.D.   On: 10/10/2017 12:51    Procedures Procedures (including critical care time)  Medications Ordered in ED Medications  ibuprofen (ADVIL,MOTRIN) tablet 800 mg (has no administration in time range)     Initial Impression / Assessment and Plan / ED Course  I have reviewed the triage vital signs and the nursing notes.  Pertinent labs & imaging results that were available during my care of the patient were reviewed by me and considered in my medical decision making (see chart for details).     Patient X-Ray negative for obvious fracture or dislocation. Pain managed in ED. Pt advised to follow up with orthopedics if symptoms persist for possibility of missed fracture diagnosis. Patient given brace while in ED, conservative therapy recommended and discussed. Patient will be dc home & is agreeable with above plan.   Final Clinical Impressions(s) / ED Diagnoses   Final diagnoses:  Contusion of left hand, initial encounter    ED Discharge Orders    None       Margarita Mail, PA-C 10/10/17 1336    Mackuen, Fredia Sorrow, MD 10/11/17 [phone]

## 2017-10-10 NOTE — Progress Notes (Signed)
Orthopedic Tech Progress Note Patient Details:  Brent West 11-Sep-1975 [id number]  Ortho Devices Type of Ortho Device: Thumb velcro splint Ortho Device/Splint Interventions: Application   Post Interventions Patient Tolerated: Well Instructions Provided: Care of device   [place] Pink 10/10/2017, 1:58 PM

## 2017-10-10 NOTE — Discharge Instructions (Addendum)
Contact a health care provider if: °Your symptoms do not improve after several days of treatment. °You have increased redness, swelling, or pain in your hand or fingers. °You have difficulty moving the injured area. °Your swelling or pain is not relieved with medicines. °Get help right away if: °You have severe pain. °Your hand or fingers become numb. °Your hand or fingers turn pale, blue, or cold. °You cannot move your hand or wrist. °Your hand is warm to the touch. °

## 2017-10-10 NOTE — ED Triage Notes (Signed)
Patient c/o L hand pain after getting accidentally slamming it between door and door frame at work. Limited ROM d/t pain, sensation intact, pulses equal bilaterally.

## 2017-12-27 MED FILL — LISINOPRIL 20 MG TABLET: 20 | 90 days supply | Qty: 180 | Fill #0

## 2018-04-03 MED FILL — LISINOPRIL 20 MG TABLET: 20 | 90 days supply | Qty: 180 | Fill #0

## 2018-07-14 MED FILL — LISINOPRIL 20 MG TABLET: 20 | 90 days supply | Qty: 180 | Fill #0

## 2018-10-19 DIAGNOSIS — E6609 Other obesity due to excess calories: Secondary | ICD-10-CM | POA: Diagnosis not present

## 2018-10-19 DIAGNOSIS — R1013 Epigastric pain: Secondary | ICD-10-CM | POA: Diagnosis not present

## 2018-10-19 DIAGNOSIS — Z1389 Encounter for screening for other disorder: Secondary | ICD-10-CM | POA: Diagnosis not present

## 2018-10-19 DIAGNOSIS — Z6833 Body mass index (BMI) 33.0-33.9, adult: Secondary | ICD-10-CM | POA: Diagnosis not present

## 2018-10-23 MED FILL — LISINOPRIL 20 MG TABLET: 20 | 90 days supply | Qty: 180 | Fill #0

## 2018-11-06 ENCOUNTER — Other Ambulatory Visit: Payer: Self-pay | Admitting: Physician Assistant

## 2018-11-06 ENCOUNTER — Other Ambulatory Visit (HOSPITAL_COMMUNITY): Payer: Self-pay | Admitting: Physician Assistant

## 2018-11-06 DIAGNOSIS — R1013 Epigastric pain: Secondary | ICD-10-CM

## 2018-11-14 ENCOUNTER — Other Ambulatory Visit: Payer: Self-pay

## 2018-11-14 ENCOUNTER — Ambulatory Visit (HOSPITAL_COMMUNITY)
Admission: RE | Admit: 2018-11-14 | Discharge: 2018-11-14 | Disposition: A | Discharge status: Home / Self Care | Payer: 59 | Source: Ambulatory Visit | Attending: Physician Assistant | Admitting: Physician Assistant

## 2018-11-14 DIAGNOSIS — R1013 Epigastric pain: Secondary | ICD-10-CM | POA: Insufficient documentation

## 2018-11-14 DIAGNOSIS — K449 Diaphragmatic hernia without obstruction or gangrene: Secondary | ICD-10-CM | POA: Diagnosis not present

## 2018-11-14 DIAGNOSIS — K5730 Diverticulosis of large intestine without perforation or abscess without bleeding: Secondary | ICD-10-CM | POA: Diagnosis not present

## 2018-12-12 DIAGNOSIS — Z6832 Body mass index (BMI) 32.0-32.9, adult: Secondary | ICD-10-CM | POA: Diagnosis not present

## 2018-12-12 DIAGNOSIS — K449 Diaphragmatic hernia without obstruction or gangrene: Secondary | ICD-10-CM | POA: Diagnosis not present

## 2018-12-12 DIAGNOSIS — I10 Essential (primary) hypertension: Secondary | ICD-10-CM | POA: Diagnosis not present

## 2018-12-12 DIAGNOSIS — R7309 Other abnormal glucose: Secondary | ICD-10-CM | POA: Diagnosis not present

## 2018-12-12 DIAGNOSIS — Z0001 Encounter for general adult medical examination with abnormal findings: Secondary | ICD-10-CM | POA: Diagnosis not present

## 2018-12-12 DIAGNOSIS — K219 Gastro-esophageal reflux disease without esophagitis: Secondary | ICD-10-CM | POA: Diagnosis not present

## 2019-01-10 MED FILL — LISINOPRIL 20 MG TABLET: 20 | 90 days supply | Qty: 180 | Fill #0

## 2019-04-26 MED FILL — LISINOPRIL 20 MG TABLET: 20 | 90 days supply | Qty: 180 | Fill #1

## 2019-10-26 MED FILL — LISINOPRIL 20 MG TABLET: 20 | 15 days supply | Qty: 30 | Fill #0

## 2019-11-26 MED FILL — LISINOPRIL 20 MG TABLET: 20 | 7 days supply | Qty: 15 | Fill #0

## 2020-05-05 IMAGING — CT CT ABDOMEN WITHOUT CONTRAST
2 of 4 series · 16 of 46 positions shown, 18 images · non-contrast
Comparison: None.

CLINICAL DATA: Epigastric pain. Check gallbladder

EXAM:
CT ABDOMEN WITHOUT CONTRAST
TECHNIQUE: Multidetector CT imaging of the abdomen was performed following the
standard protocol without IV contrast.

[Series 3: a/p w/o 5mm · axial · non-contrast · 0.93mm/px · z∈[+998,+1258]mm · 13 of 58 slices shown, 15 images]
[im 3/58  soft-tissue]
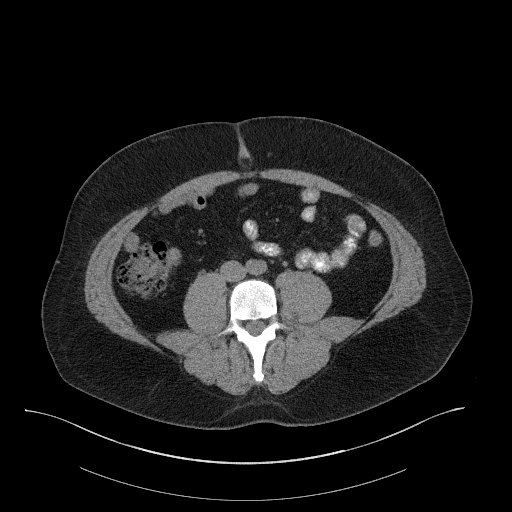
[im 3/58  bone]
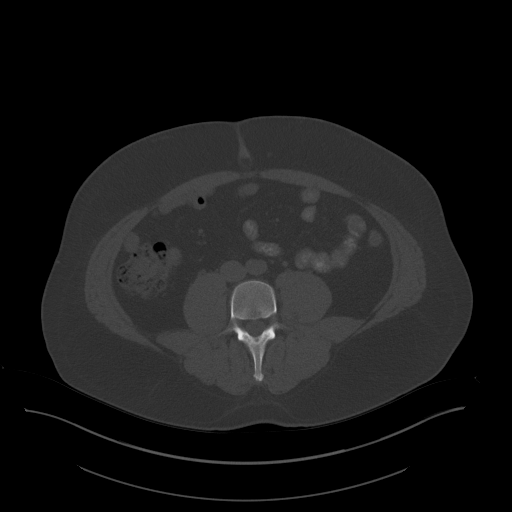
[im 7/58  soft-tissue]
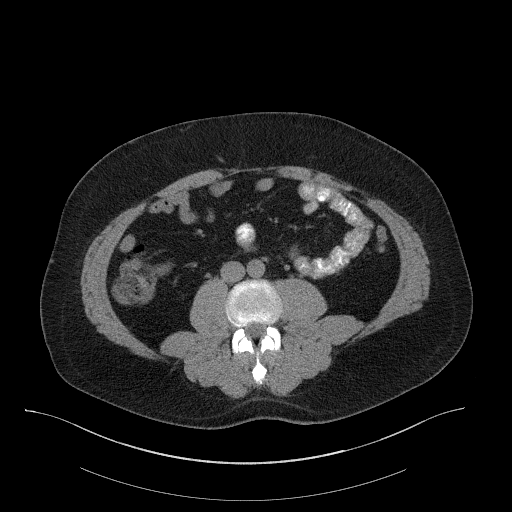
[im 12/58  soft-tissue]
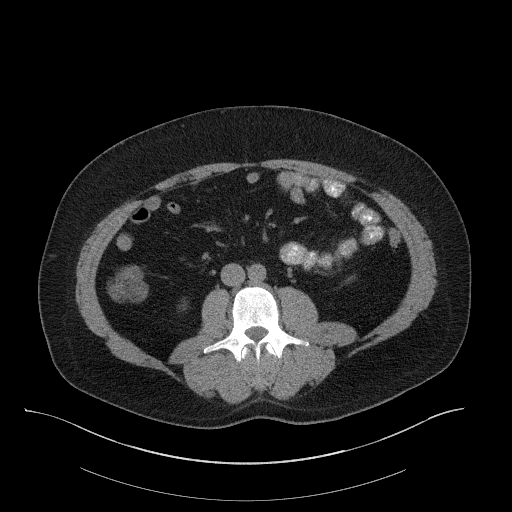
[im 16/58  soft-tissue]
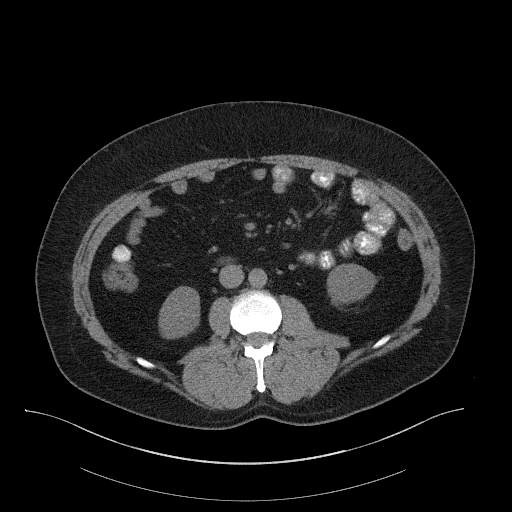
[im 21/58  soft-tissue]
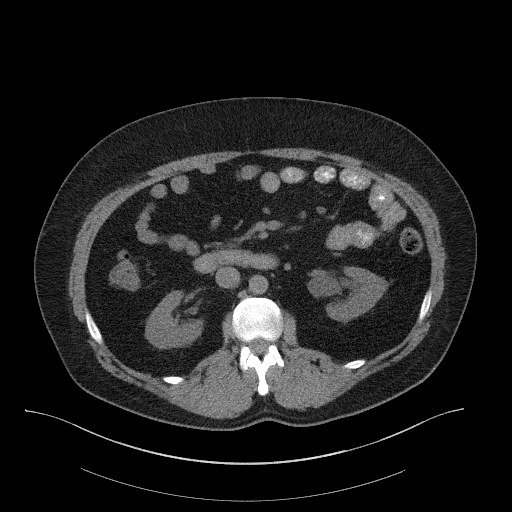
[im 26/58  soft-tissue]
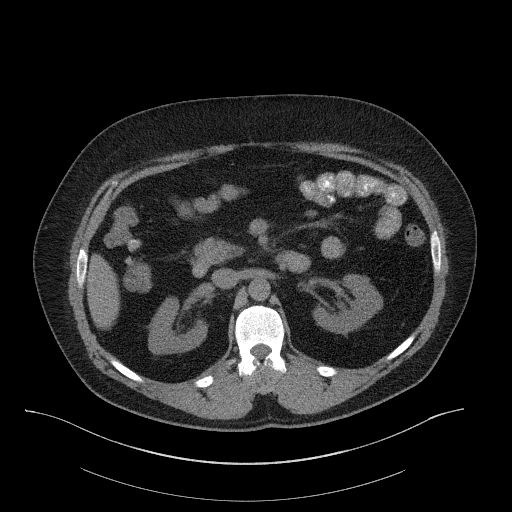
[im 30/58  soft-tissue]
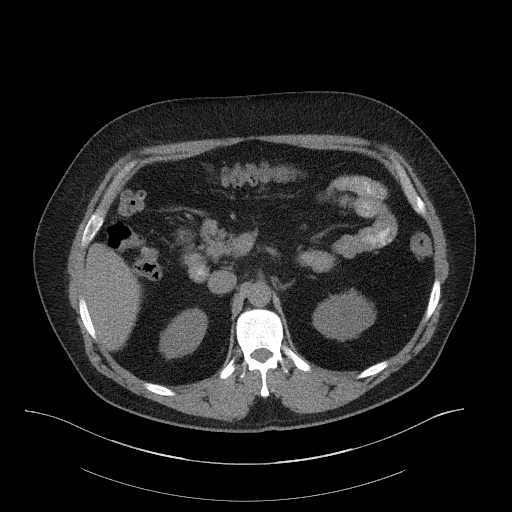
[im 32/58  soft-tissue]
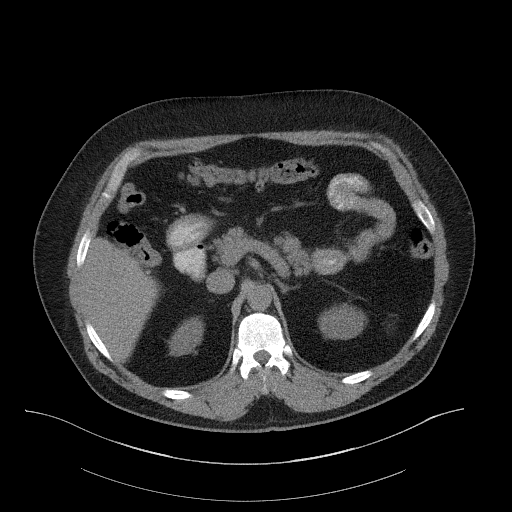
[im 37/58  soft-tissue]
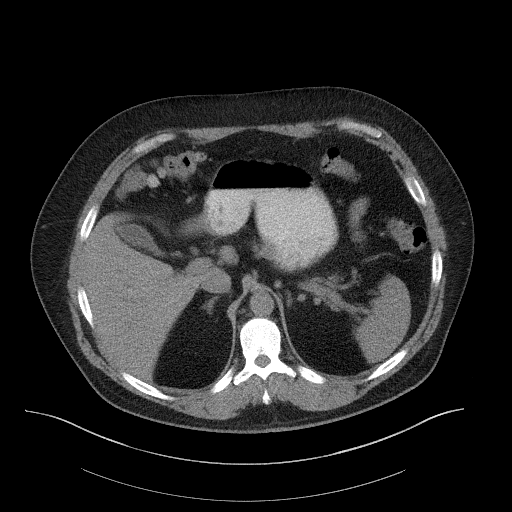
[im 37/58  bone]
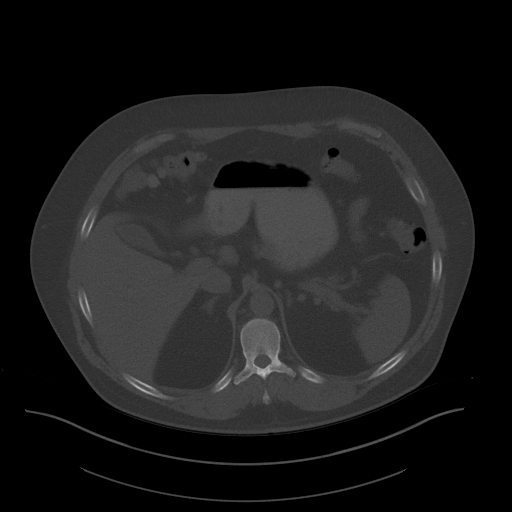
[im 42/58  soft-tissue]
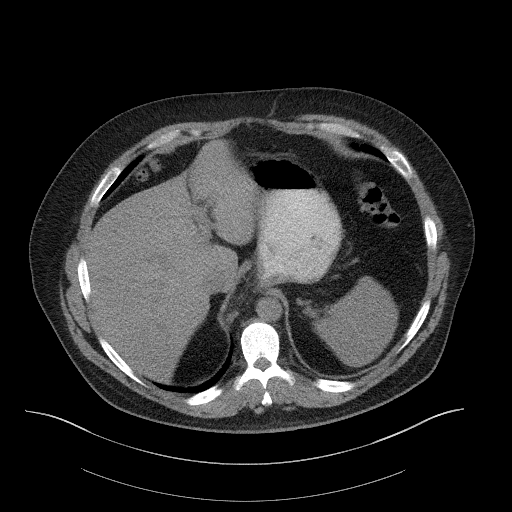
[im 46/58  soft-tissue]
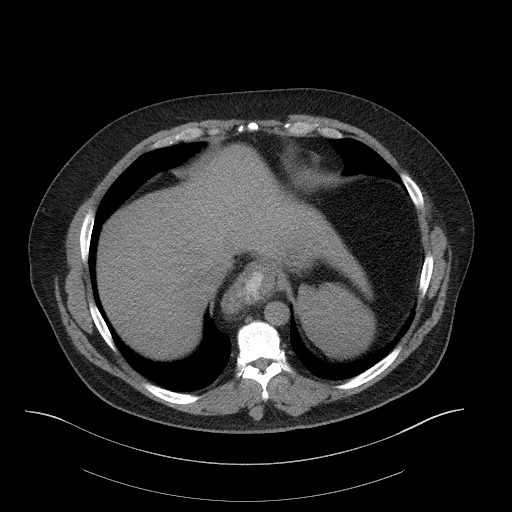
[im 51/58  soft-tissue]
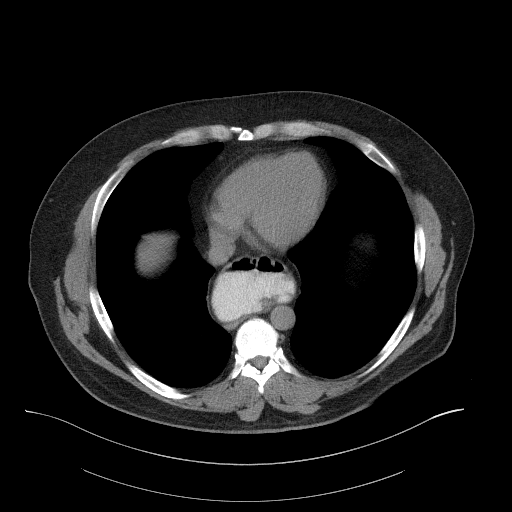
[im 55/58  soft-tissue]
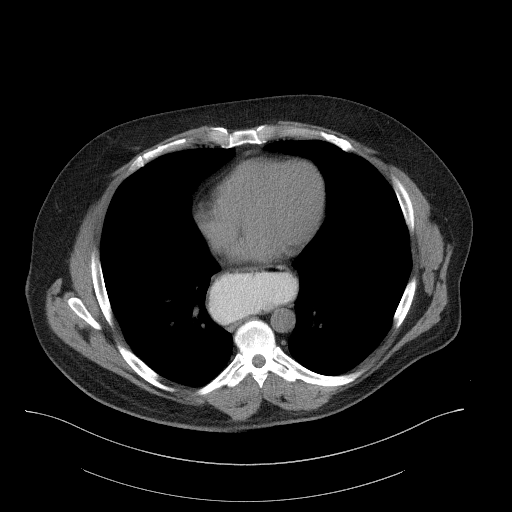

[Series 6: a/p w/o cor · coronal · non-contrast · 0.70mm/px · 3 of 151 slices shown]
[im 51/151  soft-tissue]
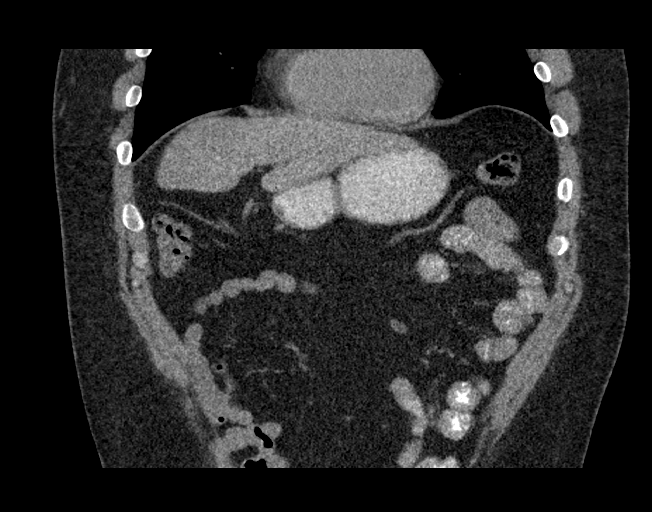
[im 67/151  soft-tissue]
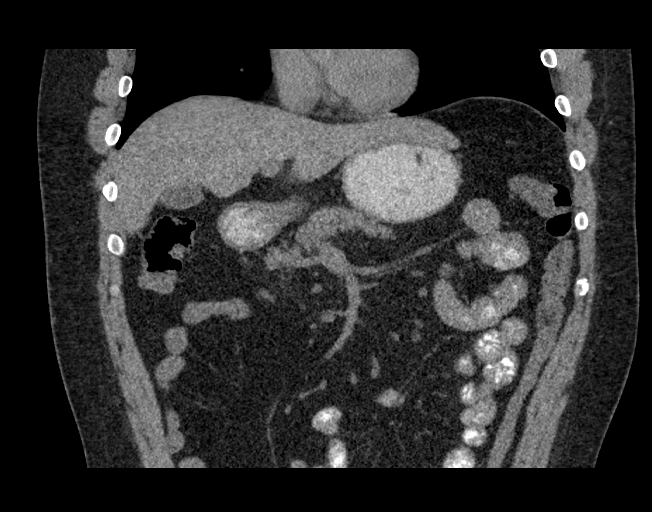
[im 84/151  soft-tissue]
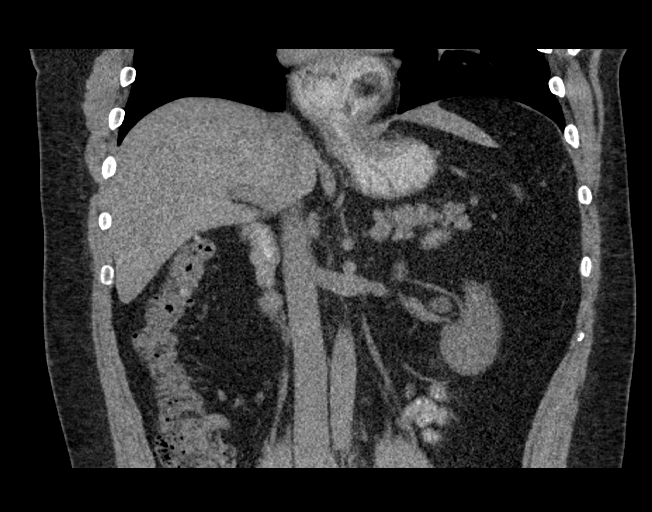

[16 of 46 positions shown; findings below may reference images not displayed]

FINDINGS: Lower chest: Lung bases are clear. Heart size is normal. Moderate
hiatal hernia.

Hepatobiliary: No focal liver abnormality is seen. No radiopaque
gallstones, biliary dilatation, or pericholecystic inflammatory
changes.

Pancreas: Unremarkable. No pancreatic ductal dilatation or
surrounding inflammatory changes.

Spleen: Normal in size without focal abnormality.

Adrenals/Urinary Tract: The kidneys and visualized portions of the
ureters are unremarkable. Note is made of extrarenal pelvis on the
LEFT.

Stomach/Bowel: Stomach and visualized small bowel loops are normal
in appearance. There are colonic diverticula. No associated
inflammatory changes. Appendix is not imaged.

Vascular/Lymphatic: No significant vascular findings are present. No
enlarged abdominal or pelvic lymph nodes.

Other: Anterior abdominal wall is unremarkable.

Musculoskeletal: No acute or significant osseous findings.
IMPRESSION: 1. No acute abnormality of the abdomen.
2. Moderate hiatal hernia.
3. Colonic diverticulosis.

## 2020-10-14 DIAGNOSIS — R7309 Other abnormal glucose: Secondary | ICD-10-CM | POA: Diagnosis not present

## 2020-10-14 DIAGNOSIS — I10 Essential (primary) hypertension: Secondary | ICD-10-CM | POA: Diagnosis not present

## 2020-10-14 DIAGNOSIS — K5730 Diverticulosis of large intestine without perforation or abscess without bleeding: Secondary | ICD-10-CM | POA: Diagnosis not present

## 2020-10-14 DIAGNOSIS — D509 Iron deficiency anemia, unspecified: Secondary | ICD-10-CM | POA: Diagnosis not present

## 2020-10-14 DIAGNOSIS — K635 Polyp of colon: Secondary | ICD-10-CM | POA: Diagnosis not present

## 2020-10-14 DIAGNOSIS — Z0001 Encounter for general adult medical examination with abnormal findings: Secondary | ICD-10-CM | POA: Diagnosis not present

## 2020-10-14 DIAGNOSIS — Z6833 Body mass index (BMI) 33.0-33.9, adult: Secondary | ICD-10-CM | POA: Diagnosis not present

## 2020-10-14 DIAGNOSIS — R001 Bradycardia, unspecified: Secondary | ICD-10-CM | POA: Diagnosis not present

## 2020-10-14 DIAGNOSIS — R718 Other abnormality of red blood cells: Secondary | ICD-10-CM | POA: Diagnosis not present

## 2020-10-14 DIAGNOSIS — K449 Diaphragmatic hernia without obstruction or gangrene: Secondary | ICD-10-CM | POA: Diagnosis not present

## 2020-10-14 DIAGNOSIS — E6609 Other obesity due to excess calories: Secondary | ICD-10-CM | POA: Diagnosis not present

## 2020-10-17 ENCOUNTER — Other Ambulatory Visit (HOSPITAL_COMMUNITY): Payer: Self-pay

## 2020-10-17 MED ORDER — FERROUS SULFATE 325 (65 FE) MG PO TABS
ORAL_TABLET | ORAL | 0 refills | Status: AC
  Filled 2020-10-17: qty 120, 60d supply, fill #0

## 2020-11-28 DIAGNOSIS — R195 Other fecal abnormalities: Secondary | ICD-10-CM | POA: Diagnosis not present

## 2020-11-28 DIAGNOSIS — K5730 Diverticulosis of large intestine without perforation or abscess without bleeding: Secondary | ICD-10-CM | POA: Diagnosis not present

## 2020-11-28 DIAGNOSIS — K635 Polyp of colon: Secondary | ICD-10-CM | POA: Diagnosis not present

## 2020-11-28 DIAGNOSIS — E6609 Other obesity due to excess calories: Secondary | ICD-10-CM | POA: Diagnosis not present

## 2020-11-28 DIAGNOSIS — D509 Iron deficiency anemia, unspecified: Secondary | ICD-10-CM | POA: Diagnosis not present

## 2020-11-28 DIAGNOSIS — Z6833 Body mass index (BMI) 33.0-33.9, adult: Secondary | ICD-10-CM | POA: Diagnosis not present

## 2020-12-09 ENCOUNTER — Encounter (INDEPENDENT_AMBULATORY_CARE_PROVIDER_SITE_OTHER): Payer: Self-pay | Admitting: *Deleted

## 2021-04-21 ENCOUNTER — Ambulatory Visit (INDEPENDENT_AMBULATORY_CARE_PROVIDER_SITE_OTHER): Payer: 59 | Admitting: Internal Medicine

## 2021-04-27 ENCOUNTER — Ambulatory Visit (INDEPENDENT_AMBULATORY_CARE_PROVIDER_SITE_OTHER): Payer: 59 | Admitting: Gastroenterology

## 2021-04-27 ENCOUNTER — Encounter (INDEPENDENT_AMBULATORY_CARE_PROVIDER_SITE_OTHER): Payer: Self-pay | Admitting: Gastroenterology

## 2021-04-27 ENCOUNTER — Other Ambulatory Visit: Payer: Self-pay

## 2021-04-27 VITALS — BP 155/98 | HR 94 | Temp 98.8°F | Ht 69.0 in | Wt 229.0 lb

## 2021-04-27 DIAGNOSIS — D509 Iron deficiency anemia, unspecified: Secondary | ICD-10-CM

## 2021-04-27 NOTE — Patient Instructions (Signed)
It was very nice to meet you!  We will check your hemoglobin and iron studies today As we discussed, if your hemoglobin/iron studies are abnormal, we may need to discuss doing a capsule study as this was not done in the past and this would complete the full evaluation to ensure there is no source of bleeding within the GI tract that we were not able to see via EGD and Colonoscopy, likely the positive stool cards were from hemorrhoids. Please continue iron/flintstone vitamins as prescribed by your PCP. Please make me aware of any new fatigue, shortness of breath, dizziness, black stools or blood in your stools.

## 2021-04-27 NOTE — Progress Notes (Signed)
Referring Provider: Sharilyn Sites, MD Primary Care Physician:  Sharilyn Sites, MD Primary GI Physician: new  Chief Complaint  Patient presents with   Blood In Stools    Patient arrives for positive occult blood in stool. States his iron was low. Taking flinstone with iron once daily. Does have a hemorroid that patient thinks is the cause of blood in stool.    HPI:   Brent West is a 46 y.o. male with past medical history of Hypertension and IDA.   Patient presenting today for positive FOBT.  Patient last seen in clinic in Feb 2019 when he was diagnosed with IDA after presenting to the ED with CP and dizziness, where hgb was found to be 7.9. He underwent colonoscopy and EGD shortly thereafter with only findings being benign colon polyps and small internal hemorrhoids. He was started on PO iron. No capsule endoscopy done at that time. Patient reports he has had no further issues with his anemia since then. PCP manages PO iron therapy.   Patient states that he thinks maybe he had had a low iron level during his physical with PCP in august, though labs sent over only included hgb which was 16 on 11/28/20. he states that PCP had him do two stools cards, one of which was positive. He is currently on ferrous sulfate 325mg  prescribed by PCP and a flintstone vitamin. He was previously on two iron pills per day but PCP cut him back to one.  He denies any rectal bleeding or melena. Denies any fatigue, sob, dizziness, chest pain or syncope. He reports that he has a lot of gurgling coming from his stomach after he eats but no pain or discomfort.  He denies issues with constipation or diarrhea, he has regular BM once per day, sometimes twice. He is currently still taking 400mg  BID for chronic knee pain, he has been on this for the past few years as he states he cannot function and do his job if he does not take something for his joint pain. He has no issue with acid reflux.   NSAID use: 400mg  ibuprofen  BID Social hx: no etoh or tobacco Fam hx:neg for CRC, father had colon polyps  Last Colonoscopy:06/13/17- The examined portion of the ileum was normal. - Diverticulosis in the sigmoid colon, at the hepatic flexure and in the ascending colon. - Five diminutive polyps in the sigmoid colon, in the transverse colon and at the hepatic flexure. Biopsied. - Small internal hemorrhoids.  Last Endoscopy:05/25/17- Normal esophagus. - Z-line irregular, 36 cm from the incisors. - 4 cm hiatal hernia. - Gastritis. - Normal duodenal bulb and second portion of the duodenum. - No specimens collected.  Recommendations: Repeat colonoscopy in 10 years Past Medical History:  Diagnosis Date   Hypertension    PONV (postoperative nausea and vomiting)     Past Surgical History:  Procedure Laterality Date   COLONOSCOPY N/A 06/13/2017   Procedure: COLONOSCOPY;  Surgeon: Rogene Houston, MD;  Location: [facility];  Service: Endoscopy;  Laterality: N/A;  730   ESOPHAGOGASTRODUODENOSCOPY N/A 05/25/2017   Procedure: ESOPHAGOGASTRODUODENOSCOPY (EGD);  Surgeon: Rogene Houston, MD;  Location: [facility];  Service: Endoscopy;  Laterality: N/A;  3:05   FRACTURE SURGERY     right thumb pinned then pin removed   KNEE ARTHROSCOPY     age 66, repeat age 66 left   TONSILLECTOMY      Current Outpatient Medications  Medication Sig Dispense Refill   diphenhydrAMINE (  BENADRYL) 25 MG tablet Take 25 mg by mouth at bedtime as needed for allergies.      ELDERBERRY PO Take by mouth. Two a day     ferrous sulfate 325 (65 FE) MG tablet take 1 tablet (325 mg) by oral route 2 times per day (Patient taking differently: Take by mouth. Once daily) 120 tablet 0   ibuprofen (ADVIL) 200 MG tablet Take 2 tablets (400 mg total) by mouth 2 (two) times daily as needed. (Patient taking differently: Take 400 mg by mouth 2 (two) times daily as needed for headache or moderate pain.) 30 tablet 0   loratadine (CLARITIN) 10 MG tablet Take  10 mg by mouth daily.     Multiple Vitamins-Minerals (ADULT GUMMY PO) Take 1 tablet by mouth daily. Flintstone with iron     OVER THE COUNTER MEDICATION Zinc once daily     Turmeric (QC TUMERIC COMPLEX PO) Take by mouth. 4 daily     No current facility-administered medications for this visit.    Allergies as of 04/27/2021   (No Known Allergies)    Family History  Problem Relation Age of Onset   Hypertension Mother    Cancer Mother    Hypertension Father    Hypertension Sister     Social History   Socioeconomic History   Marital status: Married    Spouse name: Not on file   Number of children: Not on file   Years of education: Not on file   Highest education level: Not on file  Occupational History   Not on file  Tobacco Use   Smoking status: Never   Smokeless tobacco: Never  Vaping Use   Vaping Use: Never used  Substance and Sexual Activity   Alcohol use: No   Drug use: No   Sexual activity: Not on file    Comment: Married, two daughters  Other Topics Concern   Not on file  Social History Narrative   Not on file   Social Determinants of Health   Financial Resource Strain: Not on file  Food Insecurity: Not on file  Transportation Needs: Not on file  Physical Activity: Not on file  Stress: Not on file  Social Connections: Not on file   Review of systems General: negative for malaise, night sweats, fever, chills, weight loss Neck: Negative for lumps, goiter, pain and significant neck swelling Resp: Negative for cough, wheezing, dyspnea at rest CV: Negative for chest pain, leg swelling, palpitations, orthopnea GI: denies melena, hematochezia, nausea, vomiting, diarrhea, constipation, dysphagia, odyonophagia, early satiety or unintentional weight loss.  MSK: Negative for joint pain or swelling, back pain, and muscle pain. Derm: Negative for itching or rash Psych: Denies depression, anxiety, memory loss, confusion. No homicidal or suicidal ideation.  Heme:  Negative for prolonged bleeding, bruising easily, and swollen nodes. Endocrine: Negative for cold or heat intolerance, polyuria, polydipsia and goiter. Neuro: negative for tremor, gait imbalance, syncope and seizures. The remainder of the review of systems is noncontributory.  Physical Exam: BP (!) 155/98 (BP Location: Right Arm, Patient Position: Sitting, Cuff Size: Large)    Pulse 94    Temp 98.8 F (37.1 C) (Oral)    Ht 5\' 9"  (1.753 m)    Wt 229 lb (103.9 kg)    BMI 33.82 kg/m  General:   Alert and oriented. No distress noted. Pleasant and cooperative.  Head:  Normocephalic and atraumatic. Eyes:  Conjuctiva clear without scleral icterus. Mouth:  Oral mucosa pink and moist. Good dentition. No  lesions. Heart: Normal rate and rhythm, s1 and s2 heart sounds present.  Lungs: Clear lung sounds in all lobes. Respirations equal and unlabored. Abdomen:  +BS, soft, non-tender and non-distended. No rebound or guarding. No HSM or masses noted. Derm: No palmar erythema or jaundice Msk:  Symmetrical without gross deformities. Normal posture. Extremities:  Without edema. Neurologic:  Alert and  oriented x4 Psych:  Alert and cooperative. Normal mood and affect.  Invalid input(s): 6 MONTHS   ASSESSMENT: Brent West is a 46 y.o. male presenting today for positive FOBT done by PCP.  History of IDA, diagnosed in 2019 with diagnostic EGD and colonoscopy thereafter with only findings of internal hemorrhoids and benign colon polyp. Last reviewable hgb in aug was 16, he is maintained on 325mg  ferrous sulfate and 1 flintstone vitamin per day. No recent iron studies available for review, reassuringly he has had no fatigue, sob, dizziness, chest pain, melena or rectal bleeding. We will check CBC and iron studies today. If hgb and or iron studies are abnormal, we should proceed with capsule endoscopy study, as this was not done previously, repeat of EGD and colonoscopy would likely not provide much  information given timing of last endoscopic procedures. Patient is amenable to this. He will let me know if he develops any rectal bleeding, melena, fatigue, sob, dizziness or syncope. He is still taking 400mg  ibuprofen BID, he has been taking this since prior to previous endoscopic evaluations. We discussed implications of ongoing/frequent NSAID use/GI effects and importance of decreasing NSAID dosing, patient verbalized understanding.    PLAN:  CBC 2. Iron studies 3. Proceed with capsule study if hgb/iron studies abnormal 4. Patient to let me know if he develops sob, dizziness, syncope, cp, rectal bleeding or melena.    Follow Up: TBD  Stuti Sandin L. Alver Sorrow, MSN, APRN, AGNP-C Adult-Gerontology Nurse Practitioner [facility]

## 2021-04-27 NOTE — Progress Notes (Deleted)
Patient states that he had some low iron during his physical with PCP, he states that he did two stool cards with one that was positive. He is currently on ferrous sulfate prescribed by PCP and a flintstone vitamin. He was previously on iron, two a day and was told to cut back to just one a day by PCP. He denies any rectal bleeding or melena. Denies any worsening fatigue, or sob. Denies abdominal pain. He reports that he has a lot of noise coming from his stomach after he eats. He reports he started riding a mountain bike about 2 years ago. He denies issues with constipation, he has regular once per day BMs. He is currently taking 400mg  BID for chronic knee pain.

## 2021-04-28 LAB — CBC
HCT: 47.1 % (ref 38.5–50.0)
Hemoglobin: 16.4 g/dL (ref 13.2–17.1)
MCH: 33.7 pg — ABNORMAL HIGH (ref 27.0–33.0)
MCHC: 34.8 g/dL (ref 32.0–36.0)
MCV: 96.9 fL (ref 80.0–100.0)
MPV: 9.8 fL (ref 7.5–12.5)
Platelets: 329 10*3/uL (ref 140–400)
RBC: 4.86 10*6/uL (ref 4.20–5.80)
RDW: 11.6 % (ref 11.0–15.0)
WBC: 6.7 10*3/uL (ref 3.8–10.8)

## 2021-04-28 LAB — IRON,TIBC AND FERRITIN PANEL
%SAT: 25 % (calc) (ref 20–48)
Ferritin: 118 ng/mL (ref 38–380)
Iron: 69 ug/dL (ref 50–180)
TIBC: 271 mcg/dL (calc) (ref 250–425)

## 2021-12-23 DIAGNOSIS — K635 Polyp of colon: Secondary | ICD-10-CM | POA: Diagnosis not present

## 2021-12-23 DIAGNOSIS — I10 Essential (primary) hypertension: Secondary | ICD-10-CM | POA: Diagnosis not present

## 2021-12-23 DIAGNOSIS — D509 Iron deficiency anemia, unspecified: Secondary | ICD-10-CM | POA: Diagnosis not present

## 2021-12-23 DIAGNOSIS — Z6834 Body mass index (BMI) 34.0-34.9, adult: Secondary | ICD-10-CM | POA: Diagnosis not present

## 2021-12-23 DIAGNOSIS — Z0000 Encounter for general adult medical examination without abnormal findings: Secondary | ICD-10-CM | POA: Diagnosis not present

## 2021-12-23 DIAGNOSIS — E669 Obesity, unspecified: Secondary | ICD-10-CM | POA: Diagnosis not present

## 2021-12-23 DIAGNOSIS — Z1331 Encounter for screening for depression: Secondary | ICD-10-CM | POA: Diagnosis not present

## 2022-05-18 ENCOUNTER — Other Ambulatory Visit (HOSPITAL_COMMUNITY): Payer: Self-pay

## 2022-05-18 ENCOUNTER — Telehealth: Payer: Commercial Managed Care - PPO | Admitting: Nurse Practitioner

## 2022-05-18 DIAGNOSIS — J069 Acute upper respiratory infection, unspecified: Secondary | ICD-10-CM

## 2022-05-18 MED ORDER — FLUTICASONE PROPIONATE 50 MCG/ACT NA SUSP
2.0000 | Freq: Every day | NASAL | 6 refills | Status: AC
  Filled 2022-05-18: qty 16, 30d supply, fill #0
  Filled 2022-11-09: qty 16, 30d supply, fill #1
  Filled 2023-05-18: qty 16, 30d supply, fill #2

## 2022-05-18 NOTE — Progress Notes (Signed)
Virtual Visit Consent   Brent West, you are scheduled for a virtual visit with a [facility] provider today. Just as with appointments in the office, your consent must be obtained to participate. Your consent will be active for this visit and any virtual visit you may have with one of our providers in the next 365 days. If you have a MyChart account, a copy of this consent can be sent to you electronically.  As this is a virtual visit, video technology does not allow for your provider to perform a traditional examination. This may limit your provider's ability to fully assess your condition. If your provider identifies any concerns that need to be evaluated in person or the need to arrange testing (such as labs, EKG, etc.), we will make arrangements to do so. Although advances in technology are sophisticated, we cannot ensure that it will always work on either your end or our end. If the connection with a video visit is poor, the visit may have to be switched to a telephone visit. With either a video or telephone visit, we are not always able to ensure that we have a secure connection.  By engaging in this virtual visit, you consent to the provision of healthcare and authorize for your insurance to be billed (if applicable) for the services provided during this visit. Depending on your insurance coverage, you may receive a charge related to this service.  I need to obtain your verbal consent now. Are you willing to proceed with your visit today? Brent West has provided verbal consent on 05/18/2022 for a virtual visit (video or telephone). Apolonio Schneiders, FNP  Date: 05/18/2022 11:35 AM  Virtual Visit via Video Note   I, Apolonio Schneiders, connected with  Brent West  ([id number], 01/20/1976) on 05/18/22 at 11:45 AM EST by a video-enabled telemedicine application and verified that I am speaking with the correct person using two identifiers.  Location: Patient: Virtual Visit Location Patient:  Home Provider: Virtual Visit Location Provider: Home Office   I discussed the limitations of evaluation and management by telemedicine and the availability of in person appointments. The patient expressed understanding and agreed to proceed.    History of Present Illness: Brent West is a 47 y.o. who identifies as a male who was assigned male at birth, and is being seen today for fever and chills. Symptom onset was 2 nights ago   Highest fever was 100   Slightly improved yesterday  Last night he had diarrhea  Low grade fever today  Nasal congestion today  Frontal headache   He did take a home COVID test and that was negative   Denies any other sick contacts   He has had COVID in the past two years ago    Problems:  Patient Active Problem List   Diagnosis Date Noted   Absolute anemia 05/26/2017   Iron deficiency anemia due to chronic blood loss 05/20/2017   Guaiac positive stools 05/20/2017   Blood pressure elevated 10/12/2011    Allergies: No Known Allergies Medications:  Current Outpatient Medications:    diphenhydrAMINE (BENADRYL) 25 MG tablet, Take 25 mg by mouth at bedtime as needed for allergies. , Disp: , Rfl:    ELDERBERRY PO, Take by mouth. Two a day, Disp: , Rfl:    ferrous sulfate 325 (65 FE) MG tablet, take 1 tablet (325 mg) by oral route 2 times per day (Patient taking differently: Take by mouth. Once daily), Disp: 120 tablet, Rfl:  0   ibuprofen (ADVIL) 200 MG tablet, Take 2 tablets (400 mg total) by mouth 2 (two) times daily as needed. (Patient taking differently: Take 400 mg by mouth 2 (two) times daily as needed for headache or moderate pain.), Disp: 30 tablet, Rfl: 0   loratadine (CLARITIN) 10 MG tablet, Take 10 mg by mouth daily., Disp: , Rfl:    Multiple Vitamins-Minerals (ADULT GUMMY PO), Take 1 tablet by mouth daily. Flintstone with iron, Disp: , Rfl:    OVER THE COUNTER MEDICATION, Zinc once daily, Disp: , Rfl:    Turmeric (QC TUMERIC COMPLEX PO),  Take by mouth. 4 daily, Disp: , Rfl:   Observations/Objective: Patient is well-developed, well-nourished in no acute distress.  Resting comfortably  at home.  Head is normocephalic, atraumatic.  No labored breathing.  Speech is clear and coherent with logical content.  Patient is alert and oriented at baseline.    Assessment and Plan: 1. Viral upper respiratory tract infection Meds ordered this encounter  Medications   fluticasone (FLONASE) 50 MCG/ACT nasal spray    Sig: Place 2 sprays into both nostrils daily.    Dispense:  16 g    Refill:  6     Recommended Coricidin over the counter  Alternate tylenol and ibuprofen  Consider repeat COVID testing      Follow Up Instructions: I discussed the assessment and treatment plan with the patient. The patient was provided an opportunity to ask questions and all were answered. The patient agreed with the plan and demonstrated an understanding of the instructions.  A copy of instructions were sent to the patient via MyChart unless otherwise noted below.    The patient was advised to call back or seek an in-person evaluation if the symptoms worsen or if the condition fails to improve as anticipated.  Time:  I spent 15 minutes with the patient via telehealth technology discussing the above problems/concerns.    Apolonio Schneiders, FNP

## 2022-05-27 ENCOUNTER — Other Ambulatory Visit: Payer: Self-pay

## 2022-06-13 ENCOUNTER — Telehealth: Payer: Commercial Managed Care - PPO | Admitting: Nurse Practitioner

## 2022-06-13 DIAGNOSIS — J019 Acute sinusitis, unspecified: Secondary | ICD-10-CM

## 2022-06-13 MED ORDER — AMOXICILLIN-POT CLAVULANATE 875-125 MG PO TABS
1.0000 | ORAL_TABLET | Freq: Two times a day (BID) | ORAL | 0 refills | Status: AC
  Filled 2022-06-13: qty 14, 7d supply, fill #0

## 2022-06-13 NOTE — Progress Notes (Signed)
E-Visit for Sinus Problems  We are sorry that you are not feeling well.  Here is how we plan to help!  Based on what you have shared with me it looks like you have sinusitis.  Sinusitis is inflammation and infection in the sinus cavities of the head.  Based on your presentation I believe you most likely have Acute Bacterial Sinusitis.  This is an infection caused by bacteria and is treated with antibiotics. I have prescribed Augmentin '875mg'$ /'125mg'$  one tablet twice daily with food, for 7 days. You may use an oral decongestant such as Mucinex D or if you have glaucoma or high blood pressure use plain Mucinex. Saline nasal spray help and can safely be used as often as needed for congestion.  If you develop worsening sinus pain, fever or notice severe headache and vision changes, or if symptoms are not better after completion of antibiotic, please schedule an appointment with a health care provider.    Sinus infections are not as easily transmitted as other respiratory infection, however we still recommend that you avoid close contact with loved ones, especially the very young and elderly.  Remember to wash your hands thoroughly throughout the day as this is the number one way to prevent the spread of infection!  Home Care: Only take medications as instructed by your medical team. Complete the entire course of an antibiotic. Do not take these medications with alcohol. A steam or ultrasonic humidifier can help congestion.  You can place a towel over your head and breathe in the steam from hot water coming from a faucet. Avoid close contacts especially the very young and the elderly. Cover your mouth when you cough or sneeze. Always remember to wash your hands.  Get Help Right Away If: You develop worsening fever or sinus pain. You develop a severe head ache or visual changes. Your symptoms persist after you have completed your treatment plan.  Make sure you Understand these instructions. Will watch  your condition. Will get help right away if you are not doing well or get worse.  Thank you for choosing an e-visit.  Your e-visit answers were reviewed by a board certified advanced clinical practitioner to complete your personal care plan. Depending upon the condition, your plan could have included both over the counter or prescription medications.  Please review your pharmacy choice. Make sure the pharmacy is open so you can pick up prescription now. If there is a problem, you may contact your provider through [employer] and have the prescription routed to another pharmacy.  Your safety is important to [place]. If you have drug allergies check your prescription carefully.   For the next 24 hours you can use MyChart to ask questions about today's visit, request a non-urgent call back, or ask for a work or school excuse. You will get an email in the next two days asking about your experience. I hope that your e-visit has been valuable and will speed your recovery.   I have spent at least 5 minutes reviewing and documenting in the patient's chart.

## 2022-06-14 ENCOUNTER — Other Ambulatory Visit (HOSPITAL_COMMUNITY): Payer: Self-pay

## 2022-12-15 ENCOUNTER — Encounter (INDEPENDENT_AMBULATORY_CARE_PROVIDER_SITE_OTHER): Payer: Self-pay | Admitting: *Deleted

## 2022-12-15 DIAGNOSIS — D509 Iron deficiency anemia, unspecified: Secondary | ICD-10-CM | POA: Diagnosis not present

## 2022-12-15 DIAGNOSIS — I10 Essential (primary) hypertension: Secondary | ICD-10-CM | POA: Diagnosis not present

## 2022-12-15 DIAGNOSIS — Z0000 Encounter for general adult medical examination without abnormal findings: Secondary | ICD-10-CM | POA: Diagnosis not present

## 2023-02-10 ENCOUNTER — Other Ambulatory Visit (HOSPITAL_COMMUNITY): Payer: Self-pay

## 2023-02-10 MED ORDER — AMOXICILLIN 500 MG PO CAPS
500.0000 mg | ORAL_CAPSULE | Freq: Three times a day (TID) | ORAL | 0 refills | Status: AC
  Filled 2023-02-10: qty 30, 10d supply, fill #0

## 2023-06-06 ENCOUNTER — Telehealth (INDEPENDENT_AMBULATORY_CARE_PROVIDER_SITE_OTHER): Payer: Self-pay | Admitting: Gastroenterology

## 2023-06-06 NOTE — Telephone Encounter (Signed)
 Who is your primary care physician: Dr.Golding  Reasons for the colonoscopy:   Have you had a colonoscopy before?  Yes 2019  Do you have family history of colon cancer? no  Previous colonoscopy with polyps removed? yes  Do you have a history colorectal cancer?   no  Are you diabetic? If yes, Type 1 or Type 2?    no  Do you have a prosthetic or mechanical heart valve? no  Do you have a pacemaker/defibrillator?   no  Have you had endocarditis/atrial fibrillation? no  Have you had joint replacement within the last 12 months?  no  Do you tend to be constipated or have to use laxatives? no  Do you have any history of drugs or alchohol?  no  Do you use supplemental oxygen?  no  Have you had a stroke or heart attack within the last 6 months? no  Do you take weight loss medication?  no     Do you take any blood-thinning medications such as: (aspirin, warfarin, Plavix, Aggrenox)  no  If yes we need the name, milligram, dosage and who is prescribing doctor  Current Outpatient Medications on File Prior to Visit  Medication Sig Dispense Refill   amoxicillin (AMOXIL) 500 MG capsule Take 1 capsule (500 mg total) by mouth 3 (three) times daily until finished (Patient not taking: Reported on 06/06/2023) 30 capsule 0   diphenhydrAMINE (BENADRYL) 25 MG tablet Take 25 mg by mouth at bedtime as needed for allergies.      ELDERBERRY PO Take by mouth. Two a day (Patient not taking: Reported on 06/06/2023)     ferrous sulfate 325 (65 FE) MG tablet take 1 tablet (325 mg) by oral route 2 times per day (Patient not taking: Reported on 06/06/2023) 120 tablet 0   fluticasone (FLONASE) 50 MCG/ACT nasal spray Place 2 sprays into both nostrils daily. (Patient not taking: Reported on 06/06/2023) 16 g 6   ibuprofen (ADVIL) 200 MG tablet Take 2 tablets (400 mg total) by mouth 2 (two) times daily as needed. (Patient taking differently: Take 400 mg by mouth 2 (two) times daily as needed for headache or moderate  pain (pain score 4-6).) 30 tablet 0   loratadine (CLARITIN) 10 MG tablet Take 10 mg by mouth daily. (Patient not taking: Reported on 06/06/2023)     Multiple Vitamins-Minerals (ADULT GUMMY PO) Take 1 tablet by mouth daily. Flintstone with iron (Patient not taking: Reported on 06/06/2023)     OVER THE COUNTER MEDICATION Zinc once daily (Patient not taking: Reported on 06/06/2023)     Turmeric (QC TUMERIC COMPLEX PO) Take by mouth. 4 daily (Patient not taking: Reported on 06/06/2023)     No current facility-administered medications on file prior to visit.    No Known Allergies   Pharmacy:   Primary Insurance Name: [employer] number where you can be reached:

## 2023-06-10 NOTE — Telephone Encounter (Signed)
 Left message to return call

## 2023-06-13 ENCOUNTER — Encounter (INDEPENDENT_AMBULATORY_CARE_PROVIDER_SITE_OTHER): Payer: Self-pay | Admitting: *Deleted

## 2023-06-13 ENCOUNTER — Other Ambulatory Visit (HOSPITAL_COMMUNITY): Payer: Self-pay

## 2023-06-13 MED ORDER — PEG 3350-KCL-NA BICARB-NACL 420 G PO SOLR
4000.0000 mL | Freq: Once | ORAL | 0 refills | Status: AC
  Filled 2023-06-13: qty 4000, 1d supply, fill #0
  Filled 2023-07-04: qty 4000, 2d supply, fill #0

## 2023-06-13 NOTE — Telephone Encounter (Signed)
 Referral completed, [facility] apt letter sent to PCP

## 2023-06-13 NOTE — Addendum Note (Signed)
 Addended by: Marlowe Shores on: 06/13/2023 08:33 AM   Modules accepted: Orders

## 2023-06-13 NOTE — Telephone Encounter (Signed)
 Pt left voicemail returning call Returned call to pt. Pt scheduled for 07/13/23. Instructions mailed to pt. Prep sent to pharmacy.

## 2023-06-23 ENCOUNTER — Other Ambulatory Visit (HOSPITAL_COMMUNITY): Payer: Self-pay

## 2023-07-04 ENCOUNTER — Other Ambulatory Visit (HOSPITAL_COMMUNITY): Payer: Self-pay

## 2023-07-13 ENCOUNTER — Ambulatory Visit (HOSPITAL_BASED_OUTPATIENT_CLINIC_OR_DEPARTMENT_OTHER): Admitting: Anesthesiology

## 2023-07-13 ENCOUNTER — Ambulatory Visit (HOSPITAL_COMMUNITY)
Admission: RE | Admit: 2023-07-13 | Discharge: 2023-07-13 | Disposition: A | Discharge status: Home / Self Care | Payer: Commercial Managed Care - PPO | Attending: Gastroenterology | Admitting: Gastroenterology

## 2023-07-13 ENCOUNTER — Encounter (HOSPITAL_COMMUNITY)
Admission: RE | Disposition: A | Discharge status: Home / Self Care | Payer: Self-pay | Source: Home / Self Care | Attending: Gastroenterology

## 2023-07-13 ENCOUNTER — Encounter (HOSPITAL_COMMUNITY): Payer: Self-pay | Admitting: Gastroenterology

## 2023-07-13 ENCOUNTER — Ambulatory Visit (HOSPITAL_COMMUNITY): Admitting: Anesthesiology

## 2023-07-13 ENCOUNTER — Other Ambulatory Visit: Payer: Self-pay

## 2023-07-13 DIAGNOSIS — K5730 Diverticulosis of large intestine without perforation or abscess without bleeding: Secondary | ICD-10-CM

## 2023-07-13 DIAGNOSIS — D649 Anemia, unspecified: Secondary | ICD-10-CM | POA: Diagnosis not present

## 2023-07-13 DIAGNOSIS — K621 Rectal polyp: Secondary | ICD-10-CM

## 2023-07-13 DIAGNOSIS — Z860100 Personal history of colon polyps, unspecified: Secondary | ICD-10-CM

## 2023-07-13 DIAGNOSIS — Z79899 Other long term (current) drug therapy: Secondary | ICD-10-CM | POA: Insufficient documentation

## 2023-07-13 DIAGNOSIS — K648 Other hemorrhoids: Secondary | ICD-10-CM | POA: Diagnosis not present

## 2023-07-13 DIAGNOSIS — Z1211 Encounter for screening for malignant neoplasm of colon: Secondary | ICD-10-CM

## 2023-07-13 DIAGNOSIS — D128 Benign neoplasm of rectum: Secondary | ICD-10-CM | POA: Insufficient documentation

## 2023-07-13 DIAGNOSIS — I10 Essential (primary) hypertension: Secondary | ICD-10-CM | POA: Insufficient documentation

## 2023-07-13 DIAGNOSIS — D122 Benign neoplasm of ascending colon: Secondary | ICD-10-CM | POA: Diagnosis not present

## 2023-07-13 DIAGNOSIS — D124 Benign neoplasm of descending colon: Secondary | ICD-10-CM

## 2023-07-13 DIAGNOSIS — K635 Polyp of colon: Secondary | ICD-10-CM | POA: Diagnosis not present

## 2023-07-13 DIAGNOSIS — K6389 Other specified diseases of intestine: Secondary | ICD-10-CM | POA: Diagnosis not present

## 2023-07-13 HISTORY — PX: COLONOSCOPY WITH PROPOFOL: SHX5780

## 2023-07-13 HISTORY — PX: POLYPECTOMY: SHX5525

## 2023-07-13 LAB — HM COLONOSCOPY

## 2023-07-13 SURGERY — COLONOSCOPY WITH PROPOFOL
Anesthesia: General

## 2023-07-13 MED ORDER — LACTATED RINGERS IV SOLN: INTRAVENOUS | Status: DC

## 2023-07-13 MED ORDER — LIDOCAINE HCL (PF) 2 % IJ SOLN
INTRAMUSCULAR | Status: DC | PRN
  Administered 2023-07-13: 50 mg via INTRADERMAL

## 2023-07-13 MED ORDER — LACTATED RINGERS IV SOLN
INTRAVENOUS | Status: DC | PRN
  Administered 2023-07-13: 500 mL via INTRAVENOUS

## 2023-07-13 MED ORDER — PROPOFOL 500 MG/50ML IV EMUL
INTRAVENOUS | Status: DC | PRN
  Administered 2023-07-13: 150 ug/kg/min via INTRAVENOUS

## 2023-07-13 MED ORDER — STERILE WATER FOR IRRIGATION IR SOLN
Status: DC | PRN
  Administered 2023-07-13: 120 mL

## 2023-07-13 MED ORDER — PROPOFOL 10 MG/ML IV BOLUS
INTRAVENOUS | Status: DC | PRN
  Administered 2023-07-13: 50 mg via INTRAVENOUS
  Administered 2023-07-13: 100 mg via INTRAVENOUS

## 2023-07-13 NOTE — Op Note (Signed)
 [facility] Patient Name: Brent West Procedure Date: 07/13/2023 11:59 AM MRN: [id number] Date of Birth: 03-16-1976 Attending MD: Katrinka Blazing , , [id number] CSN: [id number] Age: 48 Admit Type: Outpatient Procedure:                Colonoscopy Indications:              Screening for colorectal malignant neoplasm Providers:                Katrinka Blazing, Francoise Ceo RN, RN, Dyann Ruddle Referring MD:             Katrinka Blazing Medicines:                Monitored Anesthesia Care Complications:            No immediate complications. Estimated Blood Loss:     Estimated blood loss: none. Procedure:                Pre-Anesthesia Assessment:                           - Prior to the procedure, a History and Physical                            was performed, and patient medications, allergies                            and sensitivities were reviewed. The patient's                            tolerance of previous anesthesia was reviewed.                           - The risks and benefits of the procedure and the                            sedation options and risks were discussed with the                            patient. All questions were answered and informed                            consent was obtained.                           - ASA Grade Assessment: II - A patient with mild                            systemic disease.                           After obtaining informed consent, the colonoscope                            was passed under direct vision. Throughout the                            procedure, the  patient's blood pressure, pulse, and                            oxygen saturations were monitored continuously. The                            [id number]-HQ190L ([id number]) scope was introduced through                            the anus and advanced to the the terminal ileum.                            The colonoscopy was performed without difficulty.                             The patient tolerated the procedure well. The                            quality of the bowel preparation was good. Scope In: 1:00:32 PM Scope Out: 1:20:35 PM Scope Withdrawal Time: 0 hours 14 minutes 26 seconds  Total Procedure Duration: 0 hours 20 minutes 3 seconds  Findings:      The perianal and digital rectal examinations were normal.      The terminal ileum appeared normal.      Four sessile polyps were found in the ascending colon. The polyps were 1       to 2 mm in size. Some of them appeared to be lymph nodes. These polyps       were removed with a cold snare. Resection and retrieval were complete.      Two sessile polyps were found in the rectum and descending colon. The       polyps were 3 to 4 mm in size. These polyps were removed with a cold       snare. Resection and retrieval were complete.      A few medium-mouthed diverticula were found in the sigmoid colon,       transverse colon and ascending colon.      Non-bleeding internal hemorrhoids were found during retroflexion. The       hemorrhoids were small. Impression:               - The examined portion of the ileum was normal.                           - Four 1 to 2 mm polyps in the ascending colon,                            removed with a cold snare. Resected and retrieved.                           - Two 3 to 4 mm polyps in the rectum and in the                            descending colon, removed with a cold snare.  Resected and retrieved.                           - Diverticulosis in the sigmoid colon, in the                            transverse colon and in the ascending colon.                           - Non-bleeding internal hemorrhoids. Moderate Sedation:      Per Anesthesia Care Recommendation:           - Discharge patient to home (ambulatory).                           - Resume previous diet.                           - Await pathology results.                           -  Repeat colonoscopy for surveillance based on                            pathology results. Procedure Code(s):        --- Professional ---                           [phone], Colonoscopy, flexible; with removal of                            tumor(s), polyp(s), or other lesion(s) by snare                            technique Diagnosis Code(s):        --- Professional ---                           Z12.11, Encounter for screening for malignant                            neoplasm of colon                           K64.8, Other hemorrhoids                           D12.2, Benign neoplasm of ascending colon                           D12.8, Benign neoplasm of rectum                           D12.4, Benign neoplasm of descending colon                           K57.30, Diverticulosis of large intestine without  perforation or abscess without bleeding CPT copyright 2022 American [employer]. All rights reserved. The codes documented in this report are preliminary and upon coder review may  be revised to meet current compliance requirements. Katrinka Blazing, MD Katrinka Blazing,  07/13/2023 1:28:01 PM This report has been signed electronically. Number of Addenda: 0

## 2023-07-13 NOTE — Discharge Instructions (Signed)
 You are being discharged to home.  Resume your previous diet.  We are waiting for your pathology results.  Your physician has recommended a repeat colonoscopy for surveillance based on pathology results.

## 2023-07-13 NOTE — Anesthesia Procedure Notes (Signed)
 Date/Time: 07/13/2023 12:56 PM  Performed by: Julian Reil, CRNAPre-anesthesia Checklist: Patient identified, Emergency Drugs available, Suction available and Patient being monitored Patient Re-evaluated:Patient Re-evaluated prior to induction Oxygen Delivery Method: Nasal cannula Induction Type: IV induction Placement Confirmation: positive ETCO2

## 2023-07-13 NOTE — H&P (Signed)
 Brent West is an 48 y.o. male.   Chief Complaint: colorectal cancer screening HPI: 48 y/o M with PMH HTN, IDA, coming for screening colonoscopy.   The patient denies having any complaints such as melena, hematochezia, abdominal pain or distention, change in her bowel movement consistency or frequency, no changes in weight recently.  No family history of colorectal cancer.   Past Medical History:  Diagnosis Date   Hypertension    Iron deficiency anemia    PONV (postoperative nausea and vomiting)     Past Surgical History:  Procedure Laterality Date   COLONOSCOPY N/A 06/13/2017   Procedure: COLONOSCOPY;  Surgeon: Malissa Hippo, MD;  Location: [facility];  Service: Endoscopy;  Laterality: N/A;  730   ESOPHAGOGASTRODUODENOSCOPY N/A 05/25/2017   Procedure: ESOPHAGOGASTRODUODENOSCOPY (EGD);  Surgeon: Malissa Hippo, MD;  Location: [facility];  Service: Endoscopy;  Laterality: N/A;  3:05   FRACTURE SURGERY     right thumb pinned then pin removed   KNEE ARTHROSCOPY Left    age 44, repeat age 46 left   TONSILLECTOMY      Family History  Problem Relation Age of Onset   Hypertension Mother    Cancer Mother    Hypertension Father    Hypertension Sister    Social History:  reports that he has never smoked. He has never used smokeless tobacco. He reports that he does not drink alcohol and does not use drugs.  Allergies: No Known Allergies  Medications Prior to Admission  Medication Sig Dispense Refill   ELDERBERRY PO Take by mouth. Two a day     fluticasone (FLONASE) 50 MCG/ACT nasal spray Place 2 sprays into both nostrils daily. 16 g 6   loratadine (CLARITIN) 10 MG tablet Take 10 mg by mouth daily.     Multiple Vitamins-Minerals (ADULT GUMMY PO) Take 1 tablet by mouth daily. Flintstone with iron     Turmeric (QC TUMERIC COMPLEX PO) Take by mouth. 4 daily     amoxicillin (AMOXIL) 500 MG capsule Take 1 capsule (500 mg total) by mouth 3 (three) times daily until  finished (Patient not taking: Reported on 06/06/2023) 30 capsule 0   diphenhydrAMINE (BENADRYL) 25 MG tablet Take 25 mg by mouth at bedtime as needed for allergies.      ferrous sulfate 325 (65 FE) MG tablet take 1 tablet (325 mg) by oral route 2 times per day (Patient not taking: Reported on 06/06/2023) 120 tablet 0   ibuprofen (ADVIL) 200 MG tablet Take 2 tablets (400 mg total) by mouth 2 (two) times daily as needed. (Patient taking differently: Take 400 mg by mouth 2 (two) times daily as needed for headache or moderate pain (pain score 4-6).) 30 tablet 0   OVER THE COUNTER MEDICATION Zinc once daily (Patient not taking: Reported on 06/06/2023)      No results found for this or any previous visit (from the past 48 hours). No results found.  Review of Systems  All other systems reviewed and are negative.   Blood pressure (!) 142/101, pulse 84, temperature 98.5 F (36.9 C), temperature source Oral, resp. rate 14, height 5\' 9"  (1.753 m), weight 98.9 kg, SpO2 96%. Physical Exam  GENERAL: The patient is AO x3, in no acute distress. HEENT: Head is normocephalic and atraumatic. EOMI are intact. Mouth is well hydrated and without lesions. NECK: Supple. No masses LUNGS: Clear to auscultation. No presence of rhonchi/wheezing/rales. Adequate chest expansion HEART: RRR, normal s1 and s2. ABDOMEN: Soft,  nontender, no guarding, no peritoneal signs, and nondistended. BS +. No masses. EXTREMITIES: Without any cyanosis, clubbing, rash, lesions or edema. NEUROLOGIC: AOx3, no focal motor deficit. SKIN: no jaundice, no rashes  Assessment/Plan 48 y/o M with PMH HTN, IDA, coming for screening colonoscopy.  Will proceed with colonoscopy.  Dolores Frame, MD 07/13/2023, 12:37 PM

## 2023-07-13 NOTE — Anesthesia Preprocedure Evaluation (Signed)
 Anesthesia Evaluation  Patient identified by MRN, date of birth, ID band Patient awake    Reviewed: Allergy & Precautions, H&P , NPO status , Patient's Chart, lab work & pertinent test results, reviewed documented beta blocker date and time   History of Anesthesia Complications (+) PONV and history of anesthetic complications  Airway Mallampati: II  TM Distance: >3 FB Neck ROM: full    Dental no notable dental hx.    Pulmonary neg pulmonary ROS   Pulmonary exam normal breath sounds clear to auscultation       Cardiovascular Exercise Tolerance: Good hypertension, negative cardio ROS  Rhythm:regular Rate:Normal     Neuro/Psych negative neurological ROS  negative psych ROS   GI/Hepatic negative GI ROS, Neg liver ROS,,,  Endo/Other  negative endocrine ROS    Renal/GU negative Renal ROS  negative genitourinary   Musculoskeletal   Abdominal   Peds  Hematology negative hematology ROS (+) Blood dyscrasia, anemia   Anesthesia Other Findings   Reproductive/Obstetrics negative OB ROS                             Anesthesia Physical Anesthesia Plan  ASA: 2  Anesthesia Plan: General   Post-op Pain Management:    Induction:   PONV Risk Score and Plan: Propofol infusion  Airway Management Planned:   Additional Equipment:   Intra-op Plan:   Post-operative Plan:   Informed Consent: I have reviewed the patients History and Physical, chart, labs and discussed the procedure including the risks, benefits and alternatives for the proposed anesthesia with the patient or authorized representative who has indicated his/her understanding and acceptance.     Dental Advisory Given  Plan Discussed with: CRNA  Anesthesia Plan Comments:        Anesthesia Quick Evaluation

## 2023-07-13 NOTE — Transfer of Care (Signed)
 Immediate Anesthesia Transfer of Care Note  Patient: Brent West  Procedure(s) Performed: COLONOSCOPY WITH PROPOFOL POLYPECTOMY  Patient Location: Endoscopy Unit  Anesthesia Type:General  Level of Consciousness: drowsy  Airway & Oxygen Therapy: Patient Spontanous Breathing  Post-op Assessment: Report given to RN and Post -op Vital signs reviewed and stable  Post vital signs: Reviewed and stable  Last Vitals:  Vitals Value Taken Time  BP    Temp    Pulse    Resp    SpO2      Last Pain:  Vitals:   07/13/23 1226  TempSrc: Oral  PainSc: 0-No pain      Patients Stated Pain Goal: 8 (07/13/23 1226)  Complications: No notable events documented.

## 2023-07-14 ENCOUNTER — Encounter (HOSPITAL_COMMUNITY): Payer: Self-pay | Admitting: Gastroenterology

## 2023-07-14 ENCOUNTER — Encounter (INDEPENDENT_AMBULATORY_CARE_PROVIDER_SITE_OTHER): Payer: Self-pay | Admitting: *Deleted

## 2023-07-14 LAB — SURGICAL PATHOLOGY

## 2023-07-16 NOTE — Anesthesia Postprocedure Evaluation (Signed)
 Anesthesia Post Note  Patient: Brent West  Procedure(s) Performed: COLONOSCOPY WITH PROPOFOL POLYPECTOMY  Patient location during evaluation: Phase II Anesthesia Type: General Level of consciousness: awake Pain management: pain level controlled Vital Signs Assessment: post-procedure vital signs reviewed and stable Respiratory status: spontaneous breathing and respiratory function stable Cardiovascular status: blood pressure returned to baseline and stable Postop Assessment: no headache and no apparent nausea or vomiting Anesthetic complications: no Comments: Late entry   No notable events documented.   Last Vitals:  Vitals:   07/13/23 1226 07/13/23 1323  BP: (!) 142/101 104/64  Pulse: 84 74  Resp: 14 16  Temp: 36.9 C 36.5 C  SpO2: 96% 93%    Last Pain:  Vitals:   07/13/23 1323  TempSrc: Oral  PainSc: 0-No pain                 Windell Norfolk

## 2023-07-18 ENCOUNTER — Encounter (INDEPENDENT_AMBULATORY_CARE_PROVIDER_SITE_OTHER): Payer: Self-pay | Admitting: *Deleted

## 2023-07-18 DIAGNOSIS — H5203 Hypermetropia, bilateral: Secondary | ICD-10-CM | POA: Diagnosis not present

## 2023-08-18 ENCOUNTER — Other Ambulatory Visit: Payer: Self-pay | Admitting: Nurse Practitioner

## 2023-08-18 DIAGNOSIS — J069 Acute upper respiratory infection, unspecified: Secondary | ICD-10-CM

## 2023-08-29 ENCOUNTER — Other Ambulatory Visit (HOSPITAL_COMMUNITY): Payer: Self-pay

## 2023-09-05 ENCOUNTER — Other Ambulatory Visit (HOSPITAL_COMMUNITY): Payer: Self-pay

## 2023-12-01 ENCOUNTER — Ambulatory Visit: Admitting: Family Medicine

## 2023-12-01 ENCOUNTER — Encounter: Payer: Self-pay | Admitting: Family Medicine

## 2023-12-01 VITALS — BP 142/92 | HR 90 | Temp 98.2°F | Ht 69.0 in | Wt 231.0 lb

## 2023-12-01 DIAGNOSIS — Z23 Encounter for immunization: Secondary | ICD-10-CM

## 2023-12-01 DIAGNOSIS — Z0000 Encounter for general adult medical examination without abnormal findings: Secondary | ICD-10-CM | POA: Diagnosis not present

## 2023-12-01 DIAGNOSIS — Z125 Encounter for screening for malignant neoplasm of prostate: Secondary | ICD-10-CM

## 2023-12-01 DIAGNOSIS — Z13220 Encounter for screening for lipoid disorders: Secondary | ICD-10-CM

## 2023-12-01 NOTE — Progress Notes (Signed)
 Subjective:    Patient ID: Brent West, male    DOB: 01-07-1976, 48 y.o.   MRN: [id number]  HPI Patient is a very pleasant 48 year old Caucasian gentleman here today to establish care.  Past medical history is significant for anemia as well as borderline hypertension.  His blood pressure today is slightly elevated however he states that his blood pressure is better at home.  He denies any chest pain or shortness of breath or dyspnea on exertion.  He does have a family history of heart disease in his father who recently passed away.  His father also had kidney failure.  He denies any orthopnea, paroxysmal nocturnal dyspnea, or pleurisy.  He had a colonoscopy earlier this year that showed several polyps.  They recommended a repeat colonoscopy in 7 years.  He is due for prostate cancer screening in less than a month.  He would like to go ahead and get the PSA now.  He is due for a tetanus shot.  We discussed the shingles vaccine and the pneumonia vaccine after he turns 48.  Otherwise, the patient is healthy.  His BMI is elevated at 32.  The patient states that he has been drinking more sodas and eating poorly due to stress this year.  He also admits that he is not exercising as often as he should. Past Medical History:  Diagnosis Date   Hypertension    Iron deficiency anemia    PONV (postoperative nausea and vomiting)    Past Surgical History:  Procedure Laterality Date   COLONOSCOPY N/A 06/13/2017   Procedure: COLONOSCOPY;  Surgeon: Golda Claudis PENNER, MD;  Location: [facility];  Service: Endoscopy;  Laterality: N/A;  730   COLONOSCOPY WITH PROPOFOL N/A 07/13/2023   Procedure: COLONOSCOPY WITH PROPOFOL;  Surgeon: Eartha Angelia Sieving, MD;  Location: [facility];  Service: Gastroenterology;  Laterality: N/A;  1:15PM;ASA 1   ESOPHAGOGASTRODUODENOSCOPY N/A 05/25/2017   Procedure: ESOPHAGOGASTRODUODENOSCOPY (EGD);  Surgeon: Golda Claudis PENNER, MD;  Location: [facility];  Service:  Endoscopy;  Laterality: N/A;  3:05   FRACTURE SURGERY     right thumb pinned then pin removed   KNEE ARTHROSCOPY Left    age 39, repeat age 41 left   POLYPECTOMY  07/13/2023   Procedure: POLYPECTOMY;  Surgeon: Eartha Angelia, Sieving, MD;  Location: [facility];  Service: Gastroenterology;;   TONSILLECTOMY     Current Outpatient Medications on File Prior to Visit  Medication Sig Dispense Refill   ibuprofen (ADVIL) 200 MG tablet Take 2 tablets (400 mg total) by mouth 2 (two) times daily as needed. (Patient taking differently: Take 400 mg by mouth 2 (two) times daily as needed for headache or moderate pain (pain score 4-6).) 30 tablet 0   Turmeric (QC TUMERIC COMPLEX PO) Take by mouth. 4 daily     amoxicillin (AMOXIL) 500 MG capsule Take 1 capsule (500 mg total) by mouth 3 (three) times daily until finished (Patient not taking: Reported on 12/01/2023) 30 capsule 0   diphenhydrAMINE (BENADRYL) 25 MG tablet Take 25 mg by mouth at bedtime as needed for allergies.  (Patient not taking: Reported on 12/01/2023)     ELDERBERRY PO Take by mouth. Two a day (Patient not taking: Reported on 12/01/2023)     ferrous sulfate 325 (65 FE) MG tablet take 1 tablet (325 mg) by oral route 2 times per day (Patient not taking: Reported on 12/01/2023) 120 tablet 0   fluticasone (FLONASE) 50 MCG/ACT nasal spray Place  2 sprays into both nostrils daily. (Patient not taking: Reported on 12/01/2023) 16 g 6   loratadine (CLARITIN) 10 MG tablet Take 10 mg by mouth daily. (Patient not taking: Reported on 12/01/2023)     Multiple Vitamins-Minerals (ADULT GUMMY PO) Take 1 tablet by mouth daily. Flintstone with iron (Patient not taking: Reported on 12/01/2023)     OVER THE COUNTER MEDICATION Zinc once daily (Patient not taking: Reported on 12/01/2023)     No current facility-administered medications on file prior to visit.   No Known Allergies Social History   Socioeconomic History   Marital status: Married    Spouse name: Not  on file   Number of children: Not on file   Years of education: Not on file   Highest education level: Not on file  Occupational History   Not on file  Tobacco Use   Smoking status: Never   Smokeless tobacco: Never  Vaping Use   Vaping status: Never Used  Substance and Sexual Activity   Alcohol use: No   Drug use: No   Sexual activity: Not on file    Comment: Married, two daughters  Other Topics Concern   Not on file  Social History Narrative   Not on file   Social Drivers of Health   Financial Resource Strain: Not on file  Food Insecurity: Not on file  Transportation Needs: Not on file  Physical Activity: Not on file  Stress: Not on file  Social Connections: Not on file  Intimate Partner Violence: Not on file   Family History  Problem Relation Age of Onset   Hypertension Mother    Cancer Mother    Hypertension Father    Hypertension Sister       Review of Systems  All other systems reviewed and are negative.      Objective:   Physical Exam Vitals reviewed.  Constitutional:      General: He is not in acute distress.    Appearance: Normal appearance. He is obese. He is not ill-appearing or toxic-appearing.  HENT:     Right Ear: Tympanic membrane and ear canal normal.     Left Ear: Tympanic membrane and ear canal normal.     Nose: Nose normal.     Mouth/Throat:     Mouth: Mucous membranes are moist.     Pharynx: No oropharyngeal exudate or posterior oropharyngeal erythema.  Eyes:     Extraocular Movements: Extraocular movements intact.     Pupils: Pupils are equal, round, and reactive to light.  Neck:     Vascular: No carotid bruit.  Cardiovascular:     Rate and Rhythm: Normal rate and regular rhythm.     Heart sounds: Normal heart sounds. No murmur heard.    No friction rub. No gallop.  Pulmonary:     Effort: Pulmonary effort is normal. No respiratory distress.     Breath sounds: Normal breath sounds. No stridor. No wheezing or rhonchi.   Abdominal:     General: Abdomen is flat. Bowel sounds are normal. There is no distension.     Palpations: Abdomen is soft. There is no mass.     Tenderness: There is no abdominal tenderness.  Musculoskeletal:     Right lower leg: No edema.     Left lower leg: No edema.  Lymphadenopathy:     Cervical: No cervical adenopathy.  Skin:    Findings: No erythema or rash.  Neurological:     General: No focal deficit present.  Mental Status: He is alert and oriented to person, place, and time. Mental status is at baseline.     Cranial Nerves: No cranial nerve deficit.     Motor: No weakness.     Gait: Gait normal.           Assessment & Plan:  Screening cholesterol level - Plan: CBC with Differential/Platelet, Comprehensive metabolic panel with GFR, Lipid panel  Prostate cancer screening - Plan: PSA  Need for tetanus, diphtheria, and acellular pertussis (Tdap) vaccine - Plan: Tdap vaccine greater than or equal to 7yo IM  General medical exam My biggest concern is his blood pressure.  I have advised the patient to check his blood pressure frequently over the next 1 to 2 weeks.  If consistently greater than 140/90 I would recommend medication to lower his blood pressure.  I have also recommended 30 minutes of aerobic exercise at least 5 days a week as well as diet and weight loss.  I would like to check a CBC, CMP, lipid panel.  Ideally I would like to see his LDL cholesterol less than 899.  We discussed a coronary artery calcium score to help risk stratify his cardiovascular risk.  Patient would like to see his cholesterol first and then may consent to the coronary artery calcium score to help determine if he wants to take a statin if his cholesterol is elevated.  He prefers to avoid medication if possible.  Colonoscopy is up-to-date.  I will check his prostate with a PSA.  Patient received his tetanus shot today.

## 2023-12-02 ENCOUNTER — Ambulatory Visit: Payer: Self-pay | Admitting: Family Medicine

## 2023-12-02 LAB — COMPREHENSIVE METABOLIC PANEL WITH GFR
AG Ratio: 1.8 (calc) (ref 1.0–2.5)
ALT: 18 U/L (ref 9–46)
AST: 19 U/L (ref 10–40)
Albumin: 4.4 g/dL (ref 3.6–5.1)
Alkaline phosphatase (APISO): 91 U/L (ref 36–130)
BUN: 14 mg/dL (ref 7–25)
CO2: 26 mmol/L (ref 20–32)
Calcium: 9.7 mg/dL (ref 8.6–10.3)
Chloride: 104 mmol/L (ref 98–110)
Creat: 1.15 mg/dL (ref 0.60–1.29)
Globulin: 2.5 g/dL (ref 1.9–3.7)
Glucose, Bld: 91 mg/dL (ref 65–99)
Potassium: 4.5 mmol/L (ref 3.5–5.3)
Sodium: 139 mmol/L (ref 135–146)
Total Bilirubin: 0.7 mg/dL (ref 0.2–1.2)
Total Protein: 6.9 g/dL (ref 6.1–8.1)
eGFR: 78 mL/min/1.73m2 (ref >=60)

## 2023-12-02 LAB — CBC WITH DIFFERENTIAL/PLATELET
Absolute Lymphocytes: 1857 {cells}/uL (ref 850–3900)
Absolute Monocytes: 629 {cells}/uL (ref 200–950)
Basophils Absolute: 59 {cells}/uL (ref 0–200)
Basophils Relative: 0.8 %
Eosinophils Absolute: 252 {cells}/uL (ref 15–500)
Eosinophils Relative: 3.4 %
HCT: 50.5 % — ABNORMAL HIGH (ref 38.5–50.0)
Hemoglobin: 16.9 g/dL (ref 13.2–17.1)
MCH: 32.9 pg (ref 27.0–33.0)
MCHC: 33.5 g/dL (ref 32.0–36.0)
MCV: 98.2 fL (ref 80.0–100.0)
MPV: 10.2 fL (ref 7.5–12.5)
Monocytes Relative: 8.5 %
Neutro Abs: 4603 {cells}/uL (ref 1500–7800)
Neutrophils Relative %: 62.2 %
Platelets: 284 Thousand/uL (ref 140–400)
RBC: 5.14 Million/uL (ref 4.20–5.80)
RDW: 11.8 % (ref 11.0–15.0)
Total Lymphocyte: 25.1 %
WBC: 7.4 Thousand/uL (ref 3.8–10.8)

## 2023-12-02 LAB — LIPID PANEL
Cholesterol: 185 mg/dL (ref <200)
HDL: 39 mg/dL — ABNORMAL LOW (ref >=40)
LDL Cholesterol (Calc): 130 mg/dL — ABNORMAL HIGH
Non-HDL Cholesterol (Calc): 146 mg/dL — ABNORMAL HIGH (ref <130)
Total CHOL/HDL Ratio: 4.7 (calc) (ref <5.0)
Triglycerides: 65 mg/dL (ref <150)

## 2023-12-02 LAB — PSA: PSA: 0.72 ng/mL (ref <=4.00)

## 2024-02-01 ENCOUNTER — Encounter (INDEPENDENT_AMBULATORY_CARE_PROVIDER_SITE_OTHER): Payer: Self-pay | Admitting: Gastroenterology

## 2024-11-28 ENCOUNTER — Other Ambulatory Visit

## 2024-12-03 ENCOUNTER — Encounter: Admitting: Family Medicine
